# Patient Record
Sex: Female | Born: 1935 | Race: Black or African American | Hispanic: No | Marital: Single | Smoking: Never smoker
Health system: Southern US, Community
[De-identification: ages and names within clinical notes are randomized; demographics above are authoritative.]

## PROBLEM LIST (undated history)

## (undated) DIAGNOSIS — F0390 Unspecified dementia without behavioral disturbance: Secondary | ICD-10-CM

## (undated) DIAGNOSIS — I10 Essential (primary) hypertension: Secondary | ICD-10-CM

## (undated) DIAGNOSIS — E7800 Pure hypercholesterolemia, unspecified: Secondary | ICD-10-CM

## (undated) DIAGNOSIS — E119 Type 2 diabetes mellitus without complications: Secondary | ICD-10-CM

## (undated) DIAGNOSIS — F32A Depression, unspecified: Secondary | ICD-10-CM

## (undated) HISTORY — PX: APPENDECTOMY: SHX54

---

## 2018-03-11 ENCOUNTER — Ambulatory Visit (INDEPENDENT_AMBULATORY_CARE_PROVIDER_SITE_OTHER): Payer: Medicare Other | Admitting: Podiatry

## 2018-03-11 ENCOUNTER — Encounter: Payer: Self-pay | Admitting: Podiatry

## 2018-03-11 DIAGNOSIS — B351 Tinea unguium: Secondary | ICD-10-CM | POA: Diagnosis not present

## 2018-03-11 DIAGNOSIS — M79675 Pain in left toe(s): Secondary | ICD-10-CM | POA: Diagnosis not present

## 2018-03-11 DIAGNOSIS — M79674 Pain in right toe(s): Secondary | ICD-10-CM | POA: Diagnosis not present

## 2018-03-11 DIAGNOSIS — E1159 Type 2 diabetes mellitus with other circulatory complications: Secondary | ICD-10-CM

## 2018-03-11 NOTE — Progress Notes (Signed)
This patient presents to the office with chief complaint of long thick nails and diabetic feet.  This patient  says she  is having no pain and discomfort in her feet.  This patient says  she has long thick painful nails.  These nails are painful walking and wearing shoes.  She has no history of infection or drainage from both feet.  This patient presents the office today for treatment of the  long nails and a foot evaluation due to history of  Diabetes. Patient was referred by Dr.  Mauro KaufmannBafare.  General Appearance  Alert, conversant and in no acute stress.  Vascular  Dorsalis pedis  are weakly  palpable  Bilaterally.Posterior tibial pulses are absent  B/L. Capillary return is within normal limits  bilaterally. Cold feet noted.  Neurologic  Senn-Weinstein monofilament wire test within normal limits  bilaterally. Muscle power within normal limits bilaterally.  Nails Thick disfigured discolored nails with subungual debris  from hallux to fifth toes bilaterally. No evidence of bacterial infection or drainage bilaterally.  Orthopedic  No limitations of motion of motion feet .  No crepitus or effusions noted.  No bony pathology or digital deformities noted.  Skin  normotropic skin with no porokeratosis noted bilaterally.  No signs of infections or ulcers noted.     Onychomycosis  Diabetes with no foot complications  IE  Debride nails x 10.  A diabetic foot exam was performed and there is evidence of vascular disease with no nerve damage. RTC 3 months.   Helane GuntherGregory Alecxander Mainwaring DPM

## 2018-03-17 ENCOUNTER — Other Ambulatory Visit: Payer: Self-pay | Admitting: Internal Medicine

## 2018-03-17 DIAGNOSIS — F0391 Unspecified dementia with behavioral disturbance: Secondary | ICD-10-CM

## 2018-03-23 ENCOUNTER — Encounter (HOSPITAL_COMMUNITY): Payer: Self-pay | Admitting: Emergency Medicine

## 2018-03-23 ENCOUNTER — Emergency Department (HOSPITAL_COMMUNITY)
Admission: EM | Admit: 2018-03-23 | Discharge: 2018-03-23 | Disposition: A | Discharge status: Home / Self Care | Payer: Medicare Other | Attending: Emergency Medicine | Admitting: Emergency Medicine

## 2018-03-23 ENCOUNTER — Emergency Department (HOSPITAL_COMMUNITY): Payer: Medicare Other

## 2018-03-23 ENCOUNTER — Other Ambulatory Visit: Payer: Self-pay

## 2018-03-23 DIAGNOSIS — Z9114 Patient's other noncompliance with medication regimen: Secondary | ICD-10-CM | POA: Insufficient documentation

## 2018-03-23 DIAGNOSIS — R51 Headache: Secondary | ICD-10-CM | POA: Diagnosis not present

## 2018-03-23 DIAGNOSIS — Z7982 Long term (current) use of aspirin: Secondary | ICD-10-CM | POA: Diagnosis not present

## 2018-03-23 DIAGNOSIS — I10 Essential (primary) hypertension: Secondary | ICD-10-CM | POA: Diagnosis not present

## 2018-03-23 DIAGNOSIS — Z79899 Other long term (current) drug therapy: Secondary | ICD-10-CM | POA: Insufficient documentation

## 2018-03-23 DIAGNOSIS — Z7984 Long term (current) use of oral hypoglycemic drugs: Secondary | ICD-10-CM | POA: Diagnosis not present

## 2018-03-23 HISTORY — DX: Pure hypercholesterolemia, unspecified: E78.00

## 2018-03-23 HISTORY — DX: Essential (primary) hypertension: I10

## 2018-03-23 LAB — CBC WITH DIFFERENTIAL/PLATELET
BASOS PCT: 0 %
Basophils Absolute: 0 10*3/uL (ref 0.0–0.1)
EOS PCT: 4 %
Eosinophils Absolute: 0.2 10*3/uL (ref 0.0–0.7)
HEMATOCRIT: 38.2 % (ref 36.0–46.0)
Hemoglobin: 12.2 g/dL (ref 12.0–15.0)
Lymphocytes Relative: 43 %
Lymphs Abs: 1.8 10*3/uL (ref 0.7–4.0)
MCH: 30.7 pg (ref 26.0–34.0)
MCHC: 31.9 g/dL (ref 30.0–36.0)
MCV: 96.2 fL (ref 78.0–100.0)
Monocytes Absolute: 0.5 10*3/uL (ref 0.1–1.0)
Monocytes Relative: 11 %
NEUTROS ABS: 1.7 10*3/uL (ref 1.7–7.7)
Neutrophils Relative %: 42 %
PLATELETS: 244 10*3/uL (ref 150–400)
RBC: 3.97 MIL/uL (ref 3.87–5.11)
RDW: 14.4 % (ref 11.5–15.5)
WBC: 4.1 10*3/uL (ref 4.0–10.5)

## 2018-03-23 LAB — BASIC METABOLIC PANEL
ANION GAP: 13 (ref 5–15)
BUN: 21 mg/dL — ABNORMAL HIGH (ref 6–20)
CALCIUM: 9.9 mg/dL (ref 8.9–10.3)
CO2: 26 mmol/L (ref 22–32)
Chloride: 104 mmol/L (ref 101–111)
Creatinine, Ser: 1.05 mg/dL — ABNORMAL HIGH (ref 0.44–1.00)
GFR, EST AFRICAN AMERICAN: 54 mL/min — AB (ref >60)
GFR, EST NON AFRICAN AMERICAN: 46 mL/min — AB (ref >60)
Glucose, Bld: 81 mg/dL (ref 65–99)
Potassium: 4.2 mmol/L (ref 3.5–5.1)
Sodium: 143 mmol/L (ref 135–145)

## 2018-03-23 LAB — I-STAT TROPONIN, ED: TROPONIN I, POC: 0.01 ng/mL (ref 0.00–0.08)

## 2018-03-23 MED ORDER — AMLODIPINE BESYLATE 5 MG PO TABS
5.0000 mg | ORAL_TABLET | Freq: Once | ORAL | Status: AC
  Administered 2018-03-23: 5 mg via ORAL
  Filled 2018-03-23: qty 1

## 2018-03-23 MED ORDER — LABETALOL HCL 5 MG/ML IV SOLN
10.0000 mg | INTRAVENOUS | Status: DC | PRN
  Administered 2018-03-23 (×5): 10 mg via INTRAVENOUS
  Filled 2018-03-23 (×3): qty 4

## 2018-03-23 MED ORDER — AMLODIPINE BESYLATE 5 MG PO TABS: 5.0000 mg | ORAL_TABLET | Freq: Every day | ORAL | 0 refills | Status: DC

## 2018-03-23 MED ORDER — SODIUM CHLORIDE 0.9 % IV SOLN
INTRAVENOUS | Status: DC
  Administered 2018-03-23: 20:00:00 via INTRAVENOUS

## 2018-03-23 NOTE — ED Provider Notes (Signed)
[facility] EMERGENCY DEPARTMENT Provider Note   CSN: [id number] Arrival date & time: 03/23/18  1524     History   Chief Complaint Chief Complaint  Patient presents with  . Hypertension    HPI Angelica Lane is a 84 y.o. female.  The patient presents for evaluation of headache with high blood pressure.  Today a home health nurse came to her home and advised her to come here because her blood pressure has been elevated both today and on prior days when the family checked it at home.  Also, she has been seeing her PCP, to follow her blood pressure which has been high.  About 1 month ago her PCP advised her to take her Toprol medication, only in the morning instead of twice a day.  Apparently she was having trouble remembering taking the evening dose.  She denies blurred vision, cough, chest pain, abdominal or back pain.  There is been no focal weakness or paresthesia.  She continues to be able to ambulate normally, and does not usually require a assistive device.  She is here with family members who contribute to the history.  She has not been having trouble eating, and has not noticed any changes in her bowel or urinary habits.  There are no other known modifying factors.   HPI  Past Medical History:  Diagnosis Date  . High cholesterol   . Hypertension     There are no active problems to display for this patient.   Past Surgical History:  Procedure Laterality Date  . APPENDECTOMY       OB History   None      Home Medications    Prior to Admission medications   Medication Sig Start Date End Date Taking? Authorizing Provider  aspirin 81 MG chewable tablet aspirin 81 mg chewable tablet  Chew 1 tablet every day by oral route.   Yes [provider]  Calcium Carbonate-Vitamin D (CALCIUM 500/D PO) Calcium 500  500 daily   Yes [provider]  divalproex (DEPAKOTE SPRINKLE) 125 MG capsule divalproex 125 mg capsule,delayed release sprinkle  twice a day   Yes [provider]  escitalopram (LEXAPRO) 5 MG tablet escitalopram 5 mg tablet daily   Yes [provider]  ezetimibe (ZETIA) 10 MG tablet ezetimibe 10 mg tablet daily   Yes [provider]  losartan (COZAAR) 100 MG tablet losartan 100 mg tablet in the morning   Yes [provider]  metFORMIN (GLUCOPHAGE-XR) 500 MG 24 hr tablet metformin ER 1000 mg tablet,extended release 24 hr in the morning   Yes [provider]  metoprolol succinate (TOPROL-XL) 100 MG 24 hr tablet metoprolol succinate ER 200 mg tablet,extended release 24 hr in the morning   Yes [provider]  simvastatin (ZOCOR) 10 MG tablet simvastatin 10 mg tablet in the morning   Yes [provider]  amLODipine (NORVASC) 5 MG tablet Take 1 tablet (5 mg total) by mouth daily. 03/23/18   Mancel Bale, MD    Family History No family history on file.  Social History Social History   Tobacco Use  . Smoking status: Never Smoker  . Smokeless tobacco: Never Used  Substance Use Topics  . Alcohol use: Not Currently  . Drug use: Never     Allergies   Other   Review of Systems Review of Systems  All other systems reviewed and are negative.    Physical Exam Updated Vital Signs BP (!) 209/60  Pulse (!) 53   Temp (!) 97.5 F (36.4 C) (Oral)   Resp 16   SpO2 100%   Physical Exam  Constitutional: She is oriented to person, place, and time. She appears well-developed. No distress.  Frail, elderly  HENT:  Head: Normocephalic and atraumatic.  Eyes: Pupils are equal, round, and reactive to light. Conjunctivae and EOM are normal.  Neck: Normal range of motion and phonation normal. Neck supple.  Cardiovascular: Normal rate and regular rhythm.  Pulmonary/Chest: Effort normal and breath sounds normal. She exhibits no tenderness.  Abdominal: Soft. She exhibits no distension. There is no tenderness. There is no guarding.  Musculoskeletal: Normal range  of motion.  Neurological: She is alert and oriented to person, place, and time. She exhibits normal muscle tone.  No dysarthria, or aphasia.  Skin: Skin is warm and dry.  Psychiatric: She has a normal mood and affect. Her behavior is normal. Judgment and thought content normal.  Nursing note and vitals reviewed.    ED Treatments / Results  Labs (all labs ordered are listed, but only abnormal results are displayed) Labs Reviewed  BASIC METABOLIC PANEL - Abnormal; Notable for the following components:      Result Value   BUN 21 (*)    Creatinine, Ser 1.05 (*)    GFR calc non Af Amer 46 (*)    GFR calc Af Amer 54 (*)    All other components within normal limits  CBC WITH DIFFERENTIAL/PLATELET  I-STAT TROPONIN, ED    EKG None  Radiology Dg Chest 2 View  Result Date: 03/23/2018 CLINICAL DATA:  Headaches for 2 weeks.  Smoker. EXAM: CHEST - 2 VIEW COMPARISON:  None. FINDINGS: Cardiomediastinal silhouette is normal. Calcified aortic arch. Mild bronchitic changes. No pleural effusions or focal consolidations. Trachea projects midline and there is no pneumothorax. Soft tissue planes and included osseous structures are non-suspicious. Mild degenerative change of the thoracic spine. Old distal RIGHT clavicle fracture. IMPRESSION: Mild bronchitic changes without focal consolidation. Aortic Atherosclerosis (ICD10-I70.0). Electronically Signed   By: Awilda Metro M.D.   On: 03/23/2018 19:46   Ct Head Wo Contrast  Result Date: 03/23/2018 CLINICAL DATA:  Headache.  Elevated blood pressure for 1 week. EXAM: CT HEAD WITHOUT CONTRAST TECHNIQUE: Contiguous axial images were obtained from the base of the skull through the vertex without intravenous contrast. COMPARISON:  None. FINDINGS: Brain: No evidence of acute infarction, hemorrhage, hydrocephalus, extra-axial collection or mass lesion/mass effect. There is ventricular and sulcal enlargement reflecting age-appropriate volume loss. Vascular: No  hyperdense vessel or unexpected calcification. Skull: Normal. Negative for fracture or focal lesion. Sinuses/Orbits: Globes and orbits are unremarkable. Visualized sinuses and mastoid air cells are clear. Other: None. IMPRESSION: 1. No acute intracranial abnormalities. 2. Age related volume loss. Electronically Signed   By: Amie Portland M.D.   On: 03/23/2018 19:37    Procedures .Critical Care Performed by: Mancel Bale, MD Authorized by: Mancel Bale, MD   Critical care provider statement:    Critical care time (minutes):  35   Critical care start time:  03/23/2018 7:00 PM   Critical care end time:  03/23/2018 10:00 PM   (including critical care time)  Medications Ordered in ED Medications  amLODipine (NORVASC) tablet 5 mg (5 mg Oral Given 03/23/18 2208)     Initial Impression / Assessment and Plan / ED Course  I have reviewed the triage vital signs and the nursing notes.  Pertinent labs & imaging results that were available during my care  of the patient were reviewed by me and considered in my medical decision making (see chart for details).  Clinical Course as of Mar 25 913  Wed Mar 23, 2018  2200 Following multiple doses of labetalol, blood pressure improved, mean arterial pressure significantly improved.  No change in mental status.   [EW]  Fri Mar 25, 2018  0911 Normal  [facility]Stat Troponin[facility] [EW]  0911 Normal  CBC with Differential [EW]  0911 Normal except mildly elevated BUN and creatinine  Basic metabolic panel(!) [EW]  0911 Mild nonspecific bronchitis, no congestive heart failure or infiltrate  DG Chest 2 View [EW]  0912 No bleeding or infarct  CT Head Wo Contrast [EW]    Clinical Course User Index [EW] Mancel BaleWentz, Rafay Dahan, MD     No data found.  At discharge- reevaluation with update and discussion. After initial assessment and treatment, an updated evaluation reveals she remains alert and comfortable.  She has no further complaints.  Findings  discussed with family and patient, all questions answered. Mancel BaleElliott Belem Hintze   Medical decision making-elevated blood pressure without significant signs for end organ damage.  Doubt hypertensive emergency.  Blood pressure lowered, with intravenous medication, followed by oral antihypertensive begun in the emergency department.  Patient will need ongoing close management by her PCP, as well as additional blood pressure control with a third antihypertensive.  Doubt CVA, metabolic instability or impending vascular collapse.   Nursing Notes Reviewed/ Care Coordinated Applicable Imaging Reviewed Interpretation of Laboratory Data incorporated into ED treatment  The patient appears reasonably screened and/or stabilized for discharge and I doubt any other medical condition or other [facility] requiring further screening, evaluation, or treatment in the ED at this time prior to discharge.  Plan: Home Medications-continue current medications; Home Treatments-rest, fluids; return here if the recommended treatment, does not improve the symptoms; Recommended follow up-PCP follow-up within 1 week for blood pressure check and further management.    Final Clinical Impressions(s) / ED Diagnoses   Final diagnoses:  Hypertension, unspecified type    ED Discharge Orders        Ordered    amLODipine (NORVASC) 5 MG tablet  Daily     03/23/18 2157       Mancel BaleWentz, Janelle Culton, MD 03/25/18 [phone]

## 2018-03-23 NOTE — ED Provider Notes (Signed)
Patient placed in Quick Look pathway, seen and evaluated   Chief Complaint: blood pressure high  HPI:   Patient with history of hypertension, apparently full compliance with her medications, here because home nurse checked her blood pressure today and it was high.  Patient denies any complaints.  She reports intermittent headache, that comes and goes, describes it as mild, none at this time.  States her family doctor has increased her blood pressure medication about a month ago because her blood pressure was high at that time.  ROS: positive for headaches  Physical Exam:   Gen: No distress  Neuro: Awake and Alert  Skin: Warm    Focused Exam: NAD, lungs clear bilaterally, regular HR and rhythm. 5/5 and equal upper and lower extremity strength bilaterally. Equal grip strength bilaterally. Normal finger to nose and heel to shin. No pronator drift.   Pt hypertensive, BP is 259/84, apparently compliant with her medications that family dispenses for her. Will check basic labs.   Initiation of care has begun. The patient has been counseled on the process, plan, and necessity for staying for the completion/evaluation, and the remainder of the medical screening examination    Jaynie CrumbleKirichenko, Aidric Endicott, PA-C 03/23/18 1615    Melene PlanFloyd, Dan, DO 03/23/18 2237

## 2018-03-23 NOTE — Discharge Instructions (Signed)
Start the new prescription for blood pressure in the morning.  Call your doctor for a follow-up appointment.

## 2018-03-23 NOTE — ED Notes (Signed)
Patient transported to CT 

## 2018-03-23 NOTE — ED Triage Notes (Signed)
States a nurse came to her house today for a 6 month assessment for her insurance.  She was told her BP was high and to go to ED.  Pt denies any complaints.

## 2018-03-28 ENCOUNTER — Ambulatory Visit
Admission: RE | Admit: 2018-03-28 | Discharge: 2018-03-28 | Disposition: A | Discharge status: Home / Self Care | Payer: Medicare Other | Source: Ambulatory Visit | Attending: Internal Medicine | Admitting: Internal Medicine

## 2018-03-28 DIAGNOSIS — F0391 Unspecified dementia with behavioral disturbance: Secondary | ICD-10-CM

## 2018-03-28 MED ORDER — GADOBENATE DIMEGLUMINE 529 MG/ML IV SOLN
14.0000 mL | Freq: Once | INTRAVENOUS | Status: AC | PRN
  Administered 2018-03-28: 14 mL via INTRAVENOUS

## 2018-03-30 ENCOUNTER — Other Ambulatory Visit: Payer: Self-pay

## 2018-06-03 ENCOUNTER — Ambulatory Visit: Payer: Medicare Other | Admitting: Podiatry

## 2018-06-22 ENCOUNTER — Encounter: Payer: Self-pay | Admitting: Podiatry

## 2018-06-22 ENCOUNTER — Ambulatory Visit (INDEPENDENT_AMBULATORY_CARE_PROVIDER_SITE_OTHER): Payer: Medicare Other | Admitting: Podiatry

## 2018-06-22 DIAGNOSIS — B351 Tinea unguium: Secondary | ICD-10-CM | POA: Diagnosis not present

## 2018-06-22 DIAGNOSIS — M79675 Pain in left toe(s): Secondary | ICD-10-CM

## 2018-06-22 DIAGNOSIS — M79674 Pain in right toe(s): Secondary | ICD-10-CM

## 2018-06-22 DIAGNOSIS — E1159 Type 2 diabetes mellitus with other circulatory complications: Secondary | ICD-10-CM

## 2018-06-22 NOTE — Progress Notes (Signed)
Complaint:  Visit Type: Patient returns to my office for continued preventative foot care services. Complaint: Patient states" my nails have grown long and thick and become painful to walk and wear shoes" Patient has been diagnosed with DM with no foot complications. The patient presents for preventative foot care services. No changes to ROS  Podiatric Exam: Vascular: dorsalis pedis and posterior tibial pulses are weakly  palpable bilateral. Posterior tibial pulses are absent  B/L. Capillary return is immediate. Cold feet. Skin turgor WNL  Sensorium: Normal Semmes Weinstein monofilament test. Normal tactile sensation bilaterally. Nail Exam: Pt has thick disfigured discolored nails with subungual debris noted bilateral entire nail hallux through fifth toenails Ulcer Exam: There is no evidence of ulcer or pre-ulcerative changes or infection. Orthopedic Exam: Muscle tone and strength are WNL. No limitations in general ROM. No crepitus or effusions noted. Foot type and digits show no abnormalities. Bony prominences are unremarkable. Skin: No Porokeratosis. No infection or ulcers  Diagnosis:  Onychomycosis, , Pain in right toe, pain in left toes  Treatment & Plan Procedures and Treatment: Consent by patient was obtained for treatment procedures.   Debridement of mycotic and hypertrophic toenails, 1 through 5 bilateral and clearing of subungual debris. No ulceration, no infection noted.  Return Visit-Office Procedure: Patient instructed to return to the office for a follow up visit 3 months for continued evaluation and treatment.    Angelica Lane DPM

## 2018-09-14 ENCOUNTER — Encounter: Payer: Self-pay | Admitting: Podiatry

## 2018-09-14 ENCOUNTER — Ambulatory Visit (INDEPENDENT_AMBULATORY_CARE_PROVIDER_SITE_OTHER): Payer: Medicare Other | Admitting: Podiatry

## 2018-09-14 DIAGNOSIS — M79675 Pain in left toe(s): Secondary | ICD-10-CM | POA: Diagnosis not present

## 2018-09-14 DIAGNOSIS — M79674 Pain in right toe(s): Secondary | ICD-10-CM

## 2018-09-14 DIAGNOSIS — E1159 Type 2 diabetes mellitus with other circulatory complications: Secondary | ICD-10-CM

## 2018-09-14 DIAGNOSIS — B351 Tinea unguium: Secondary | ICD-10-CM

## 2018-09-14 NOTE — Progress Notes (Signed)
Complaint:  Visit Type: Patient returns to my office for continued preventative foot care services. Complaint: Patient states" my nails have grown long and thick and become painful to walk and wear shoes" Patient has been diagnosed with DM with no foot complications. The patient presents for preventative foot care services. No changes to ROS  Podiatric Exam: Vascular: dorsalis pedis and posterior tibial pulses are weakly  palpable bilateral. Posterior tibial pulses are absent  B/L. Capillary return is immediate. Cold feet. Skin turgor WNL  Sensorium: Normal Semmes Weinstein monofilament test. Normal tactile sensation bilaterally. Nail Exam: Pt has thick disfigured discolored nails with subungual debris noted bilateral entire nail hallux through fifth toenails Ulcer Exam: There is no evidence of ulcer or pre-ulcerative changes or infection. Orthopedic Exam: Muscle tone and strength are WNL. No limitations in general ROM. No crepitus or effusions noted. Foot type and digits show no abnormalities. Bony prominences are unremarkable. Skin: No Porokeratosis. No infection or ulcers  Diagnosis:  Onychomycosis, , Pain in right toe, pain in left toes  Treatment & Plan Procedures and Treatment: Consent by patient was obtained for treatment procedures.   Debridement of mycotic and hypertrophic toenails, 1 through 5 bilateral and clearing of subungual debris. No ulceration, no infection noted.  Return Visit-Office Procedure: Patient instructed to return to the office for a follow up visit 3 months for continued evaluation and treatment.    Sincere Berlanga DPM 

## 2018-09-21 ENCOUNTER — Ambulatory Visit: Payer: Medicare Other | Admitting: Podiatry

## 2018-12-19 ENCOUNTER — Ambulatory Visit: Payer: Medicare Other | Attending: Internal Medicine | Admitting: Physical Therapy

## 2018-12-19 ENCOUNTER — Encounter: Payer: Self-pay | Admitting: Physical Therapy

## 2018-12-19 ENCOUNTER — Other Ambulatory Visit: Payer: Self-pay

## 2018-12-19 DIAGNOSIS — R262 Difficulty in walking, not elsewhere classified: Secondary | ICD-10-CM | POA: Insufficient documentation

## 2018-12-19 DIAGNOSIS — R42 Dizziness and giddiness: Secondary | ICD-10-CM | POA: Diagnosis present

## 2018-12-19 DIAGNOSIS — H8113 Benign paroxysmal vertigo, bilateral: Secondary | ICD-10-CM | POA: Insufficient documentation

## 2018-12-19 NOTE — Therapy (Signed)
[facility] [address] [place], [place], [address] Phone: [phone]   Fax:  [phone]  Physical Therapy Evaluation  Patient Details  Name: Angelica Lane MRN: [id number] Date of Birth: 12/27/1927 Referring Provider (PT): Harvest ForestBakare, Mobolaji B, MD   Encounter Date: 12/19/2018  PT End of Session - 12/19/18 1836    Visit Number  1    Number of Visits  5    Date for PT Re-Evaluation  01/18/19    Authorization Type  Medicare A & B; BCBS    Authorization Time Period  12/19/18 to 02/17/19    PT Start Time  1450    PT Stop Time  1532    PT Time Calculation (min)  42 min    Activity Tolerance  Patient tolerated treatment well    Behavior During Therapy  [facility] for tasks assessed/performed       Past Medical History:  Diagnosis Date  . High cholesterol   . Hypertension     Past Surgical History:  Procedure Laterality Date  . APPENDECTOMY      There were no vitals filed for this visit.   Subjective Assessment - 12/19/18 1453    Subjective  At night I just get dizzy. Sometimes when I roll over. The room feels like it's spinning. Nothing during the day. Denies dizziness when she sits up to EOB    Patient is accompained by:  --   caregiver, Steward DroneBrenda   Pertinent History  CKD, HTN, DM, HLD, glaucoma, dementia (with paranoia)    Patient Stated Goals  to not feel like the room is spinning; reduce dizziness to reduce risk of falling    Currently in Pain?  No/denies         [facility] - 12/19/18 1457      Assessment   Medical Diagnosis   BPPV bil    Referring Provider (PT)  Harvest ForestBakare, Mobolaji B, MD    Onset Date/Surgical Date  --   referral 11/17/18   Prior Therapy  none      Precautions   Precautions  Fall      Restrictions   Weight Bearing Restrictions  No      Balance Screen   Has the patient fallen in the past 6 months  No    Has the patient had a decrease in activity level because of a fear of falling?    No    Is the patient reluctant to leave their home because of a fear of falling?   No      Home Environment   Living Environment  Private residence    Living Arrangements  Children    Available Help at Discharge  Family;Personal care attendant;Available PRN/intermittently   nearly 24/7     Prior Function   Level of Independence  Needs assistance with homemaking;Independent with household mobility with device      Cognition   Overall Cognitive Status  History of cognitive impairments - at baseline      Observation/Other Assessments   Observations  shuffling gait while carries her cane      Bed Mobility   Bed Mobility  Rolling Lane;Rolling Left;Lane Sidelying to Sit;Left Sidelying to Sit;Sit to Sidelying Lane;Sit to Sidelying Left    Rolling Lane  Minimal Assistance - Patient > 75%    Rolling Left  Minimal Assistance - Patient > 75%    Lane Sidelying to Sit  Minimal Assistance - Patient > 75%    Left  Sidelying to Sit  Minimal Assistance - Patient >75%    Sit to Sidelying Lane  Minimal Assistance - Patient > 75%    Sit to Sidelying Left  Minimal Assistance - Patient > 75%      Transfers   Transfers  Sit to Stand    Sit to Stand  4: Min guard      Ambulation/Gait   Ambulation/Gait  Yes    Ambulation/Gait Assistance  4: Min guard    Ambulation Distance (Feet)  40 Feet   x2   Assistive device  Other (Comment)   carries SPC with tip 4" above ground   Gait Pattern  Shuffle           Vestibular Assessment - 12/19/18 0001      Vestibular Assessment   General Observation  shuffling gait, wide BOS      Symptom Behavior   Type of Dizziness  Spinning    Frequency of Dizziness  daily    Duration of Dizziness  minutes    Aggravating Factors  Rolling to Lane;Rolling to left    Relieving Factors  Head stationary      Occulomotor Exam   Occulomotor Alignment  Normal    Spontaneous  Absent    Gaze-induced  Absent    Smooth Pursuits  Saccades    Saccades  Intact       Positional Testing   Dix-Hallpike  Dix-Hallpike Left    Sidelying Test  Sidelying Lane;Sidelying Left    Horizontal Canal Testing  Horizontal Canal Lane;Horizontal Canal Left;Horizontal Canal Lane Intensity;Horizontal Canal Left Intensity      Dix-Hallpike Left   Dix-Hallpike Left Duration  15     Dix-Hallpike Left Symptoms  Upbeat, left rotatory nystagmus      Sidelying Lane   Sidelying Lane Duration  0    Sidelying Lane Symptoms  No nystagmus      Sidelying Left   Sidelying Left Duration  30    Sidelying Left Symptoms  Left nystagmus      Horizontal Canal Lane   Horizontal Canal Lane Duration  30    Horizontal Canal Lane Symptoms  Geotrophic      Horizontal Canal Left   Horizontal Canal Left Duration  45    Horizontal Canal Left Symptoms  Geotrophic      Horizontal Canal Lane Intensity   Horizontal Canal Lane Intensity  Mild      Horizontal Canal Left Intensity   Horizontal Canal Left Intensity  Moderate          Objective measurements completed on examination: See above findings.       Vestibular Treatment/Exercise - 12/19/18 0001      Vestibular Treatment/Exercise   Vestibular Treatment Provided  Canalith Repositioning    Canalith Repositioning  Epley Manuever Left;Appiani Lane       EPLEY MANUEVER LEFT   Number of Reps   1    Overall Response   --   unable to assess afterwards due to lack of time    RESPONSE DETAILS LEFT  +nystagmus steps 1,2, 4      Appiani Lane   Number of Reps   2    Overall Response   Symptoms Resolved    Response Details   to her rt to treat left horiz BPPV;             PT Education - 12/19/18 1834    Education Details  what is BPPV and how to PT can treat;  expect to feel Off-balance for 1-2 hours after treatment; if symptoms resolved, can cancel future appts    Person(s) Educated  Patient;Caregiver(s)    Methods  Explanation    Comprehension  Verbalized understanding          PT Long Term Goals -  12/19/18 1850      PT LONG TERM GOAL #1   Title  Patient will have negative positional testing for BPPV (Target all LTGs 01/18/2019)    Time  4    Period  Weeks    Status  New    Target Date  01/18/19      PT LONG TERM GOAL #2   Title  Patient will report she is asymptomatic when rolling over in her bed at night.     Time  4    Period  Weeks    Status  New      PT LONG TERM GOAL #3   Title  Assess for vestibular hypofunction if appropriate/indicated.     Time  4    Period  Weeks    Status  New             Plan - 12/19/18 1840    Clinical Impression Statement  Patient referred to OPPT with diagnosis of bil BPPV with pt reporting room spins when she rolls over in bed. Patient initially found to have left horizontal canalithiasis which was successfully treated with Appiani maneuver. Patient then found to have left posterior canalithiasis and completed one repetition of left Epley maneuver (limited by time). Patient experiencing anticipated sense of imbalance after cannalith repositioning and therefore unable to be sure that BPPV has been fully cleared. Recommend patient make additional appointments for re-assessment and treatment. Patient can benefit from PT to address the deficits listed below via the interventions listed below.     History and Personal Factors relevant to plan of care:  PMH-CKD, HTN, DM, HLD, glaucoma, dementia (with paranoia); Personal factors-decr cognition however has supportive aide and family    Clinical Presentation  Evolving    Clinical Presentation due to:  +BPPV with nystagmus; unsure if cleared after one session    Clinical Decision Making  Low    Rehab Potential  Good    PT Frequency  1x / week    PT Duration  4 weeks    PT Treatment/Interventions  ADLs/Self Care Home Management;Canalith Repostioning;Therapeutic exercise;Balance training;Neuromuscular re-education;Patient/family education;Vestibular;Visual/perceptual remediation/compensation    PT Next  Visit Plan  reassess for left posterior BPPV, left horizontal BPPV and treat as needed    Consulted and Agree with Plan of Care  Patient;Family member/caregiver    Family Member [employer], aide       Patient will benefit from skilled therapeutic intervention in order to improve the following deficits and impairments:  Decreased balance, Dizziness, Difficulty walking  Visit Diagnosis: BPPV (benign paroxysmal positional vertigo), bilateral - Plan: PT plan of care cert/re-cert  Difficulty in walking, not elsewhere classified - Plan: PT plan of care cert/re-cert  Dizziness and giddiness - Plan: PT plan of care cert/re-cert     Problem List There are no active problems to display for this patient.   Zena Amos, PT 12/19/2018, 6:54 PM  [facility] [address] [place], [place], [address] Phone: [phone]   Fax:  [phone]  Name: Adabella Mansour MRN: [id number] Date of Birth: [birth year]

## 2018-12-21 ENCOUNTER — Ambulatory Visit (INDEPENDENT_AMBULATORY_CARE_PROVIDER_SITE_OTHER): Payer: Medicare Other | Admitting: Podiatry

## 2018-12-21 ENCOUNTER — Encounter: Payer: Self-pay | Admitting: Podiatry

## 2018-12-21 DIAGNOSIS — M79675 Pain in left toe(s): Secondary | ICD-10-CM

## 2018-12-21 DIAGNOSIS — B351 Tinea unguium: Secondary | ICD-10-CM | POA: Diagnosis not present

## 2018-12-21 DIAGNOSIS — E1159 Type 2 diabetes mellitus with other circulatory complications: Secondary | ICD-10-CM

## 2018-12-21 DIAGNOSIS — M79674 Pain in right toe(s): Secondary | ICD-10-CM | POA: Diagnosis not present

## 2018-12-21 NOTE — Progress Notes (Addendum)
Complaint:  Visit Type: Patient returns to my office for continued preventative foot care services. Complaint: Patient states" my nails have grown long and thick and become painful to walk and wear shoes" Patient has been diagnosed with DM with no foot complications. The patient presents for preventative foot care services. No changes to ROS  Podiatric Exam: Vascular: dorsalis pedis and posterior tibial pulses are weakly  palpable bilateral. Posterior tibial pulses are absent  B/L. Capillary return is immediate. Cold feet. Skin turgor WNL  Sensorium: Normal Semmes Weinstein monofilament test. Normal tactile sensation bilaterally. Nail Exam: Pt has thick disfigured discolored nails with subungual debris noted bilateral entire nail hallux through fifth toenails Ulcer Exam: There is no evidence of ulcer or pre-ulcerative changes or infection. Orthopedic Exam: Muscle tone and strength are WNL. No limitations in general ROM. No crepitus or effusions noted. Foot type and digits show no abnormalities. Bony prominences are unremarkable. Skin: No Porokeratosis. No infection or ulcers  Diagnosis:  Onychomycosis, , Pain in right toe, pain in left toes  Treatment & Plan Procedures and Treatment: Consent by patient was obtained for treatment procedures.   Debridement of mycotic and hypertrophic toenails, 1 through 5 bilateral and clearing of subungual debris. No ulceration, no infection noted.  ABN signed for 2020. Return Visit-Office Procedure: Patient instructed to return to the office for a follow up visit 3 months for continued evaluation and treatment.    Helane Gunther DPM

## 2019-01-06 ENCOUNTER — Encounter: Payer: Self-pay | Admitting: Physical Therapy

## 2019-01-06 ENCOUNTER — Ambulatory Visit: Payer: Medicare Other | Admitting: Physical Therapy

## 2019-01-06 DIAGNOSIS — H8113 Benign paroxysmal vertigo, bilateral: Secondary | ICD-10-CM

## 2019-01-06 DIAGNOSIS — R42 Dizziness and giddiness: Secondary | ICD-10-CM

## 2019-01-07 NOTE — Therapy (Signed)
[facility] [address] [place], [place], [address] Phone: [phone]   Fax:  [phone]  Physical Therapy Treatment  Patient Details  Name: Katherina RightBetty Wenzl MRN: [id number] Date of Birth: 07/21/1928 Referring Provider (PT): Harvest ForestBakare, Mobolaji B, MD   Encounter Date: 01/06/2019  PT End of Session - 01/06/19 1454    Visit Number  2    Number of Visits  5    Date for PT Re-Evaluation  01/18/19    Authorization Type  Medicare A & B; BCBS    Authorization Time Period  12/19/18 to 02/17/19    PT Start Time  1452    PT Stop Time  1528    PT Time Calculation (min)  36 min    Activity Tolerance  Patient tolerated treatment well    Behavior During Therapy  [facility] for tasks assessed/performed       Past Medical History:  Diagnosis Date  . High cholesterol   . Hypertension     Past Surgical History:  Procedure Laterality Date  . APPENDECTOMY      There were no vitals filed for this visit.  Subjective Assessment - 01/06/19 1700    Subjective  Dizziness is less intense. Still has spinning at night only (when lying in bed and on her back). Now reports she can stop the dizziness by turning over onto her side (previously rolling provoked spinning, but no longer).    Patient is accompained by:  --   Caregiver remained in lobby   Pertinent History  CKD, HTN, DM, HLD, glaucoma, dementia (with paranoia)    Patient Stated Goals  to not feel like the room is spinning; reduce dizziness to reduce risk of falling    Currently in Pain?  No/denies             Vestibular Assessment - 01/06/19 1456      Symptom Behavior   Type of Dizziness  Spinning    Frequency of Dizziness  not every day, only at night when lying in the bed    Duration of Dizziness  seconds    Relieving Factors  Comments   turn on her side     Vestibulo-Occular Reflex   VOR to Slow Head Movement  Normal    Comment  could not relax enough to allow accurate HIT       Dix-Hallpike Left   Dix-Hallpike Left Duration  45 sec     Dix-Hallpike Left Symptoms  Downbeat, right rotatory nystagmus   2nd rep after straight head hang & demi-semont, upbeating left rotary               Vestibular Treatment/Exercise - 01/06/19 2122      Vestibular Treatment/Exercise   Vestibular Treatment Provided  Canalith Repositioning    Canalith Repositioning  Epley Manuever Left   straight head hang; demi-semont to rt with head turn rt      EPLEY MANUEVER LEFT   Number of Reps   2   Overall Response   Improved Symptoms     RESPONSE DETAILS LEFT             PT Education - 01/06/19 2125    Education Details  if symptoms resolve, may cancel final appt    Person(s) Educated  Patient;Caregiver(s)    Methods  Explanation    Comprehension  Verbalized understanding          PT Long Term Goals - 12/19/18 1850  PT LONG TERM GOAL #1   Title  Patient will have negative positional testing for BPPV (Target all LTGs 01/18/2019)    Time  4    Period  Weeks    Status  New    Target Date  01/18/19      PT LONG TERM GOAL #2   Title  Patient will report she is asymptomatic when rolling over in her bed at night.     Time  4    Period  Weeks    Status  New      PT LONG TERM GOAL #3   Title  Assess for vestibular hypofunction if appropriate/indicated.     Time  4    Period  Weeks    Status  New            Plan - 01/07/19 3557    Clinical Impression Statement  Patient reported improvement after initial evaluation and treatment (see subjective). Left Dix-Hallpike initially produced downbeating right rotary nystagmus. Completed left Epley and suspected left posterior canal apogeotropic BPPV. Performed straight head hang followed by quick return to longsitting followed by demi-Semont for left posterior canal. On reassessment of left Dix-Hallpike, pt with upbeating left rotary nystagmus and treated with left Epley. Patient with no symptoms on return  to upright sitting (each time previously she still had spinning). Instructed pt to keep her next appointment scheduled and to return if still having spinning, however if we cleared the crystals today she should not have further spinning and can call to cancel final appt. Also informed pt's caregiver who was waiting in lobby.     Rehab Potential  Good    PT Frequency  1x / week    PT Duration  4 weeks    PT Treatment/Interventions  ADLs/Self Care Home Management;Canalith Repostioning;Therapeutic exercise;Balance training;Neuromuscular re-education;Patient/family education;Vestibular;Visual/perceptual remediation/compensation    PT Next Visit Plan  reassess for left posterior BPPV, and treat as needed    Consulted and Agree with Plan of Care  Patient;Family member/caregiver    Family Member [employer], aide       Patient will benefit from skilled therapeutic intervention in order to improve the following deficits and impairments:  Decreased balance, Dizziness, Difficulty walking  Visit Diagnosis: BPPV (benign paroxysmal positional vertigo), bilateral  Dizziness and giddiness     Problem List There are no active problems to display for this patient.   Zena Amos, PT 01/07/2019, 8:31 AM  [facility] [address] [place], [place], [address] Phone: [phone]   Fax:  [phone]  Name: Delema Cohan MRN: [id number] Date of Birth: [birth year]

## 2019-01-20 ENCOUNTER — Ambulatory Visit: Payer: Medicare Other | Attending: Internal Medicine | Admitting: Physical Therapy

## 2019-03-22 ENCOUNTER — Ambulatory Visit: Payer: Medicare Other | Admitting: Podiatry

## 2019-06-09 ENCOUNTER — Ambulatory Visit: Payer: Medicare Other | Admitting: Podiatry

## 2019-08-31 ENCOUNTER — Other Ambulatory Visit: Payer: Self-pay | Admitting: Internal Medicine

## 2019-08-31 DIAGNOSIS — E2839 Other primary ovarian failure: Secondary | ICD-10-CM

## 2019-10-25 ENCOUNTER — Other Ambulatory Visit: Payer: Self-pay

## 2019-10-25 ENCOUNTER — Encounter (HOSPITAL_COMMUNITY): Payer: Self-pay

## 2019-10-25 ENCOUNTER — Ambulatory Visit (HOSPITAL_COMMUNITY)
Admission: EM | Admit: 2019-10-25 | Discharge: 2019-10-25 | Disposition: A | Discharge status: Home / Self Care | Payer: Medicare Other | Attending: Family Medicine | Admitting: Family Medicine

## 2019-10-25 DIAGNOSIS — W19XXXA Unspecified fall, initial encounter: Secondary | ICD-10-CM | POA: Insufficient documentation

## 2019-10-25 DIAGNOSIS — F0390 Unspecified dementia without behavioral disturbance: Secondary | ICD-10-CM | POA: Insufficient documentation

## 2019-10-25 DIAGNOSIS — R42 Dizziness and giddiness: Secondary | ICD-10-CM | POA: Diagnosis present

## 2019-10-25 DIAGNOSIS — Z79899 Other long term (current) drug therapy: Secondary | ICD-10-CM | POA: Diagnosis not present

## 2019-10-25 DIAGNOSIS — Y92009 Unspecified place in unspecified non-institutional (private) residence as the place of occurrence of the external cause: Secondary | ICD-10-CM | POA: Insufficient documentation

## 2019-10-25 DIAGNOSIS — Z7901 Long term (current) use of anticoagulants: Secondary | ICD-10-CM | POA: Diagnosis not present

## 2019-10-25 DIAGNOSIS — E7800 Pure hypercholesterolemia, unspecified: Secondary | ICD-10-CM | POA: Insufficient documentation

## 2019-10-25 DIAGNOSIS — R269 Unspecified abnormalities of gait and mobility: Secondary | ICD-10-CM

## 2019-10-25 DIAGNOSIS — I10 Essential (primary) hypertension: Secondary | ICD-10-CM | POA: Insufficient documentation

## 2019-10-25 DIAGNOSIS — Z7984 Long term (current) use of oral hypoglycemic drugs: Secondary | ICD-10-CM | POA: Diagnosis not present

## 2019-10-25 LAB — CK: Total CK: 114 U/L (ref 38–234)

## 2019-10-25 LAB — BASIC METABOLIC PANEL
Anion gap: 10 (ref 5–15)
BUN: 31 mg/dL — ABNORMAL HIGH (ref 8–23)
CO2: 23 mmol/L (ref 22–32)
Calcium: 9.8 mg/dL (ref 8.9–10.3)
Chloride: 108 mmol/L (ref 98–111)
Creatinine, Ser: 1.39 mg/dL — ABNORMAL HIGH (ref 0.44–1.00)
GFR calc Af Amer: 39 mL/min — ABNORMAL LOW (ref >60)
GFR calc non Af Amer: 34 mL/min — ABNORMAL LOW (ref >60)
Glucose, Bld: 243 mg/dL — ABNORMAL HIGH (ref 70–99)
Potassium: 4.6 mmol/L (ref 3.5–5.1)
Sodium: 141 mmol/L (ref 135–145)

## 2019-10-25 LAB — POCT URINALYSIS DIP (DEVICE)
Bilirubin Urine: NEGATIVE
Glucose, UA: NEGATIVE mg/dL
Hgb urine dipstick: NEGATIVE
Nitrite: NEGATIVE
Protein, ur: 30 mg/dL — AB
Specific Gravity, Urine: 1.015 (ref 1.005–1.030)
Urobilinogen, UA: 0.2 mg/dL (ref 0.0–1.0)
pH: 6 (ref 5.0–8.0)

## 2019-10-25 NOTE — ED Triage Notes (Signed)
Pt presents to UC w/ c/o dizziness today after putting her clothes on in the chair. Pt was found by caretaker on ground on her bottom out of chair putting her clothes on. Pt states she just started a stronger BP medication this past week. Pt denies hitting her head during fall.

## 2019-10-25 NOTE — Discharge Instructions (Addendum)
We will call if the lab work is abnormal Monitor for worsening symptoms and if so then go to the ER.  Otherwise follow up with your primary care doctor.

## 2019-10-26 LAB — URINE CULTURE

## 2019-10-26 NOTE — ED Provider Notes (Signed)
[place]    CSN: [id number] Arrival date & time: 10/25/19  1422      History   Chief Complaint Chief Complaint  Patient presents with  . Dizziness    HPI Angelica Lane is a 85 y.o. female.   Patient is an 85 year old female with past medical history of high cholesterol, hypertension, dementia.  She presents today with dizziness and fall.  Reporting that she was sitting in a chair putting her clothes on this morning and got mildly dizzy where she slid to the floor.  She landed on her bottom.  She did not hit her head or lose any consciousness.  Caregiver found her sitting on a chair.  She was sitting there approximately 1 hour.  Denies any current dizziness or headache but she is having trouble with her gait.  She was started on a new blood pressure medication this week.  Blood pressure is 127/49 here today.  She is bradycardic but history of same.  No blurred vision, chest pain or shortness of breath.  Her son is here with her today.  No fever, chills, abdominal pain, back pain, nausea, vomiting, diarrhea, constipation, urinary frequency,, dysuria or hematuria.  She denies any pain. No recent viral illnes.   ROS per HPI      Past Medical History:  Diagnosis Date  . High cholesterol   . Hypertension     There are no active problems to display for this patient.   Past Surgical History:  Procedure Laterality Date  . APPENDECTOMY      OB History   No obstetric history on file.      Home Medications    Prior to Admission medications   Medication Sig Start Date End Date Taking? Authorizing Provider  carvedilol (COREG) 12.5 MG tablet Take 12.5 mg by mouth 2 (two) times daily with a meal.   Yes [provider]  cloNIDine (CATAPRES) 0.1 MG tablet Take 0.1 mg by mouth 2 (two) times daily.   Yes [provider]  memantine (NAMENDA) 5 MG tablet Take 5 mg by mouth 2 (two) times daily.   Yes [provider]  OLANZapine (ZYPREXA) 2.5 MG  tablet Take 2.5 mg by mouth at bedtime.   Yes [provider]  amLODipine (NORVASC) 5 MG tablet Take 1 tablet (5 mg total) by mouth daily. Patient taking differently: Take 10 mg by mouth daily.  03/23/18   Daleen Bo, MD  ammonium lactate (LAC-HYDRIN) 12 % lotion Apply topically 2 (two) times daily. 08/22/18   [provider]  ammonium lactate (LAC-HYDRIN) 12 % lotion ammonium lactate 12 % lotion  Apply topically BID    [provider]  Calcium Carbonate-Vitamin D (CALCIUM 500/D PO) Calcium 500  500 daily    [provider]  donepezil (ARICEPT) 5 MG tablet Take 5 mg by mouth daily. 08/01/18   [provider]  escitalopram (LEXAPRO) 5 MG tablet escitalopram 5 mg tablet daily    [provider]  ezetimibe (ZETIA) 10 MG tablet ezetimibe 10 mg tablet daily    [provider]  losartan (COZAAR) 100 MG tablet losartan 100 mg tablet in the morning    [provider]  metFORMIN (GLUCOPHAGE-XR) 500 MG 24 hr tablet 500 mg 2 (two) times daily.     [provider]  simvastatin (ZOCOR) 10 MG tablet simvastatin 10 mg tablet in the morning    [provider]  divalproex (DEPAKOTE SPRINKLE) 125 MG capsule divalproex 125 mg capsule,delayed  release sprinkle twice a day  10/25/19  [provider]  metoprolol succinate (TOPROL-XL) 100 MG 24 hr tablet metoprolol succinate ER 200 mg tablet,extended release 24 hr in the morning  10/25/19  [provider]    Family History Family History  Problem Relation Age of Onset  . Healthy Mother   . Healthy Father     Social History Social History   Tobacco Use  . Smoking status: Never Smoker  . Smokeless tobacco: Never Used  Substance Use Topics  . Alcohol use: Not Currently  . Drug use: Never     Allergies   Other   Review of Systems Review of Systems   Physical Exam Triage Vital Signs ED Triage Vitals  Enc Vitals Group     BP 10/25/19 1439  (!) 127/49     Pulse Rate 10/25/19 1439 (!) 51     Resp 10/25/19 1439 16     Temp 10/25/19 1439 97.6 F (36.4 C)     Temp Source 10/25/19 1439 Oral     SpO2 10/25/19 1439 100 %     Weight --      Height --      Head Circumference --      Peak Flow --      Pain Score 10/25/19 1444 0     Pain Loc --      Pain Edu? --      Excl. in GC? --    No data found.  Updated Vital Signs BP (!) 127/49 (BP Location: Left Arm)   Pulse (!) 51   Temp 97.6 F (36.4 C) (Oral)   Resp 16   SpO2 100%   Visual Acuity Right Eye Distance:   Left Eye Distance:   Bilateral Distance:    Right Eye Near:   Left Eye Near:    Bilateral Near:     Physical Exam Vitals signs and nursing note reviewed.  Constitutional:      General: She is not in acute distress.    Appearance: She is not ill-appearing, toxic-appearing or diaphoretic.  HENT:     Head: Normocephalic and atraumatic.     Nose: Nose normal.     Mouth/Throat:     Pharynx: Oropharynx is clear.  Eyes:     Extraocular Movements: Extraocular movements intact.     Conjunctiva/sclera: Conjunctivae normal.     Comments: Pinpoint pupils but equal.   Neck:     Musculoskeletal: Normal range of motion.  Cardiovascular:     Rate and Rhythm: Normal rate and regular rhythm.     Pulses: Normal pulses.     Heart sounds: Normal heart sounds.  Pulmonary:     Effort: Pulmonary effort is normal.     Breath sounds: Normal breath sounds.  Abdominal:     Palpations: Abdomen is soft.     Tenderness: There is no abdominal tenderness.  Skin:    General: Skin is warm and dry.  Neurological:     General: No focal deficit present.     Mental Status: She is alert. Mental status is at baseline.     Cranial Nerves: No cranial nerve deficit.     Sensory: No sensory deficit.     Motor: Weakness present.     Gait: Gait abnormal.     Comments: No slurred speech, facial droop.  Alert to her normal.  Strength equal in all extremities.  Shuffled walking.    Psychiatric:        Mood and  Affect: Mood normal.      UC Treatments / Results  Labs (all labs ordered are listed, but only abnormal results are displayed) Labs Reviewed  URINE CULTURE - Abnormal; Notable for the following components:      Result Value   Culture MULTIPLE SPECIES PRESENT, SUGGEST RECOLLECTION (*)    All other components within normal limits  BASIC METABOLIC PANEL - Abnormal; Notable for the following components:   Glucose, Bld 243 (*)    BUN 31 (*)    Creatinine, Ser 1.39 (*)    GFR calc non Af Amer 34 (*)    GFR calc Af Amer 39 (*)    All other components within normal limits  POCT URINALYSIS DIP (DEVICE) - Abnormal; Notable for the following components:   Ketones, ur TRACE (*)    Protein, ur 30 (*)    Leukocytes,Ua SMALL (*)    All other components within normal limits  CK    EKG   Radiology No results found.  Procedures Procedures (including critical care time)  Medications Ordered in UC Medications - No data to display  Initial Impression / Assessment and Plan / UC Course  I have reviewed the triage vital signs and the nursing notes.  Pertinent labs & imaging results that were available during my care of the patient were reviewed by me and considered in my medical decision making (see chart for details).     85 year old female presenting today with dizziness, fall and abnormal gait.  Patient has a history of dementia. No head injury Her vital signs are stable and she is nontoxic looking today. No unilateral weakness, slurred speech or facial droop  She has shuffled gait and needing assistance to walk. Typically she walks well at home and sometimes uses a walker. No concerning signs or symptoms of CVA today. Urine with small leuks.  Will send for culture. Recent change in blood pressure medication.  She is not hypotensive today. Blood work showed elevated glucose in the 240s. Blood work also showed kidney disease which is most likely  chronic. Looking at previous labs creatinine and GFR was similar to previous blood work taken back in June. She could be mildly dehydrated.  We are unsure of how much she has been drinking. Patient is also suffering from polypharmacy which could be the cause of her symptoms. We will have her increase fluids and follow-up with her primary care doctor. If she starts developing any more severe dizziness, blurred vision, strokelike symptoms, chest pain or shortness of breath she will need to go to the ER. Patient and son understand and agree. Final Clinical Impressions(s) / UC Diagnoses   Final diagnoses:  Fall in home, initial encounter     Discharge Instructions     We will call if the lab work is abnormal Monitor for worsening symptoms and if so then go to the ER.  Otherwise follow up with your primary care doctor.     ED Prescriptions    None     PDMP not reviewed this encounter.   Janace ArisBast, Hays Dunnigan A, NP 10/26/19 1526

## 2019-11-28 ENCOUNTER — Other Ambulatory Visit: Payer: Medicare Other

## 2020-02-16 ENCOUNTER — Other Ambulatory Visit: Payer: Medicare Other

## 2020-02-20 ENCOUNTER — Other Ambulatory Visit: Payer: Self-pay

## 2020-02-20 ENCOUNTER — Encounter: Payer: Self-pay | Admitting: Podiatry

## 2020-02-20 ENCOUNTER — Ambulatory Visit (INDEPENDENT_AMBULATORY_CARE_PROVIDER_SITE_OTHER): Payer: Medicare Other | Admitting: Podiatry

## 2020-02-20 DIAGNOSIS — E1159 Type 2 diabetes mellitus with other circulatory complications: Secondary | ICD-10-CM

## 2020-02-20 DIAGNOSIS — M79674 Pain in right toe(s): Secondary | ICD-10-CM

## 2020-02-20 DIAGNOSIS — B351 Tinea unguium: Secondary | ICD-10-CM

## 2020-02-20 DIAGNOSIS — M79675 Pain in left toe(s): Secondary | ICD-10-CM | POA: Diagnosis not present

## 2020-02-20 NOTE — Progress Notes (Signed)
This patient returns to my office for at risk foot care.  This patient requires this care by a professional since this patient will be at risk due to having  Diabetes.  She presents with her caregiver.  Patient has not been seen in over one year. This patient is unable to cut nails themselves since the patient cannot reach their nails.These nails are painful walking and wearing shoes.  This patient presents for at risk foot care today.  General Appearance  Alert, conversant and in no acute stress.  Vascular  Dorsalis pedis and posterior tibial  pulses are absent   bilaterally.  Capillary return is within normal limits  bilaterally. Temperature is within normal limits  bilaterally.  Neurologic  Senn-Weinstein monofilament wire test diminished   bilaterally. Muscle power within normal limits bilaterally.  Nails Thick disfigured discolored nails with subungual debris  from hallux to fifth toes bilaterally. No evidence of bacterial infection or drainage bilaterally.  Orthopedic  No limitations of motion  feet .  No crepitus or effusions noted.  No bony pathology or digital deformities noted.  Skin  Dry scaly skin with no porokeratosis  B/L. bilaterally.  No signs of infections or ulcers noted.     Onychomycosis  Pain in right toe  Pain in left toe.  Consent was obtained for treatment procedures.  Debridement and grinding of long thick nails with clearing of subungual debris.  No infection or ulcer.     Return office visit   3 months.       Told patient to return for periodic foot care and evaluation due to potential at risk complications.   Helane Gunther DPM

## 2020-03-22 ENCOUNTER — Other Ambulatory Visit: Payer: Medicare Other

## 2020-05-21 ENCOUNTER — Ambulatory Visit: Payer: Medicare Other | Admitting: Podiatry

## 2020-08-21 ENCOUNTER — Emergency Department (HOSPITAL_COMMUNITY): Payer: Medicare Other

## 2020-08-21 ENCOUNTER — Encounter (HOSPITAL_COMMUNITY): Payer: Self-pay | Admitting: *Deleted

## 2020-08-21 ENCOUNTER — Other Ambulatory Visit: Payer: Self-pay

## 2020-08-21 ENCOUNTER — Emergency Department (HOSPITAL_COMMUNITY)
Admission: EM | Admit: 2020-08-21 | Discharge: 2020-08-21 | Disposition: A | Discharge status: Home / Self Care | Payer: Medicare Other | Attending: Emergency Medicine | Admitting: Emergency Medicine

## 2020-08-21 DIAGNOSIS — R42 Dizziness and giddiness: Secondary | ICD-10-CM | POA: Diagnosis not present

## 2020-08-21 DIAGNOSIS — W04XXXA Fall while being carried or supported by other persons, initial encounter: Secondary | ICD-10-CM | POA: Insufficient documentation

## 2020-08-21 DIAGNOSIS — Y929 Unspecified place or not applicable: Secondary | ICD-10-CM | POA: Insufficient documentation

## 2020-08-21 DIAGNOSIS — Y999 Unspecified external cause status: Secondary | ICD-10-CM | POA: Insufficient documentation

## 2020-08-21 DIAGNOSIS — S0990XA Unspecified injury of head, initial encounter: Secondary | ICD-10-CM

## 2020-08-21 DIAGNOSIS — Z7984 Long term (current) use of oral hypoglycemic drugs: Secondary | ICD-10-CM | POA: Diagnosis not present

## 2020-08-21 DIAGNOSIS — E119 Type 2 diabetes mellitus without complications: Secondary | ICD-10-CM | POA: Insufficient documentation

## 2020-08-21 DIAGNOSIS — M25561 Pain in right knee: Secondary | ICD-10-CM

## 2020-08-21 DIAGNOSIS — Y939 Activity, unspecified: Secondary | ICD-10-CM | POA: Insufficient documentation

## 2020-08-21 DIAGNOSIS — W19XXXA Unspecified fall, initial encounter: Secondary | ICD-10-CM

## 2020-08-21 DIAGNOSIS — Z79899 Other long term (current) drug therapy: Secondary | ICD-10-CM | POA: Insufficient documentation

## 2020-08-21 DIAGNOSIS — I10 Essential (primary) hypertension: Secondary | ICD-10-CM | POA: Diagnosis not present

## 2020-08-21 HISTORY — DX: Unspecified dementia, unspecified severity, without behavioral disturbance, psychotic disturbance, mood disturbance, and anxiety: F03.90

## 2020-08-21 HISTORY — DX: Type 2 diabetes mellitus without complications: E11.9

## 2020-08-21 HISTORY — DX: Depression, unspecified: F32.A

## 2020-08-21 NOTE — ED Notes (Signed)
Pt seen ambulating with assistance of nurse tech. Pt able ambulate with use of walker without difficulty at this time.

## 2020-08-21 NOTE — ED Triage Notes (Signed)
Pt fell this morning in bathroom. Hit nose, lip and eye on rt side. No LOC, fell while getting dressed. No thinners

## 2020-08-21 NOTE — TOC Initial Note (Signed)
Transition of Care [facility]) - Initial/Assessment Note    Patient Details  Name: Angelica Lane MRN: [id number] Date of Birth: September 20, 1928  [facility]) CM/SW Contact:    Elliot Cousin, RN Phone Number:  [phone] 08/21/2020, 5:44 PM  Clinical Narrative:                 TOC CM spoke to pt's DIL, Bridgette. Pt lives in home with son and DIL. States they are seeking Memory Care for long term goals. Pt has a caregiver that come 3x per week from 12-4 pm. Family pays out of pocket for caregiver. They plan to apply for Medicaid. Explained she will need FL2 from the PCP to start process for Memory Care. HH will be arranged with for RN, PT and SW. Explained HH SW will assist with getting FL2 and placement of Memory Care. Offered choice. DIL agreed on [facility]. Contacted [facility] rep, Kandee Keen and explained pt's case. Able to accept referral.   Expected Discharge Plan: Home w Home Health Services Barriers to Discharge: No Barriers Identified   Patient Goals and CMS Choice Patient states their goals for this hospitalization and ongoing recovery are:: would prefer patient have some assistance with getting arranged to go to Memory Care CMS Medicare.gov Compare Post Acute Care list provided to:: Patient Represenative (must comment) Secretary/administrator) Choice offered to / list presented to : Adult Children  Expected Discharge Plan and Services Expected Discharge Plan: Home w Home Health Services In-house Referral: Clinical Social Work Discharge Planning Services: CM Consult Post Acute Care Choice: Home Health Living arrangements for the past 2 months: Single Family Home                           HH Arranged: RN, PT, Social Work HH Agency: [facility] Date [facility] Agency Contacted: 08/21/20 Time HH Agency Contacted: [phone] Representative spoke with at [facility] Agency: Lorenza Chick  Prior Living Arrangements/Services Living arrangements for the past 2 months: Single Family Home Lives  with:: Adult Children Patient language and need for interpreter reviewed:: Yes Do you feel safe going back to the place where you live?: Yes      Need for Family Participation in Patient Care: Yes (Comment) Care giver support system in place?: Yes (comment) Current home services: DME (rolling walker) Criminal Activity/Legal Involvement Pertinent to Current Situation/Hospitalization: No - Comment as needed  Activities of Daily Living      Permission Sought/Granted Permission sought to share information with : Case Manager, PCP, Family Supports, Other (comment) Permission granted to share information with : Yes, Verbal Permission Granted  Share Information with NAME: Estate manager/land agent  Permission granted to share info w AGENCY: Home Health  Permission granted to share info w Relationship: DIL  Permission granted to share info w Contact Information: [phone]  Emotional Assessment       Orientation: : Oriented to Self   Psych Involvement: No (comment)  Admission diagnosis:  Fall There are no problems to display for this patient.  PCP:  [place], MD Pharmacy:   [facility] [phone], [place] - [address] [place] [address] Phone: [phone] Fax: [phone]     Social Determinants of Health (SDOH) Interventions    Readmission Risk Interventions No flowsheet data found.

## 2020-08-21 NOTE — ED Provider Notes (Signed)
[facility]-EMERGENCY DEPT Provider Note   CSN: [id number] Arrival date & time: 08/21/20  1247     History Chief Complaint  Patient presents with  . Fall    Angelica Lane is a 86 y.o. female.  HPI  Patient is 86 year old female with history of dementia, depression, DM, HLD, HTN  Patient is presented today with fall that occurred prior to arrival today.  She states that she frequently feels dizzy over the past several years and states that she went on to feeling somewhat dizzy and before she was able to sit down she fell.  She states she fell onto the ground in the kitchen.  Her daughter-in-law is with her and able to corroborate this.  Her daughter witnessed the fall and states that she did not lose consciousness or experience any vomiting.  She states that her mental status has been within normal limits.  Patient denies any pain currently and states she had no headache, chest pain or shortness of breath prior to fall.  No abdominal pain. She states that she did have some right knee pain but denies any other areas of pain.  She describes right knee pain is aching, only with movement.  Has taken medications prior to arrival in the ER.  Denies any other associated symptoms.  Denies any aggravating or mitigating factors.  Patient has been having numerous falls recently.  Family called patient's PCP who recommended ER visit for CT scan.     Past Medical History:  Diagnosis Date  . Dementia (HCC)   . Depression   . Diabetes mellitus without complication (HCC)   . High cholesterol   . Hypertension     There are no problems to display for this patient.   Past Surgical History:  Procedure Laterality Date  . APPENDECTOMY       OB History   No obstetric history on file.     Family History  Problem Relation Age of Onset  . Healthy Mother   . Healthy Father     Social History   Tobacco Use  . Smoking status: Never Smoker  . Smokeless tobacco: Never  Used  Substance Use Topics  . Alcohol use: Not Currently  . Drug use: Never    Home Medications Prior to Admission medications   Medication Sig Start Date End Date Taking? Authorizing Provider  amLODipine (NORVASC) 5 MG tablet Take 1 tablet (5 mg total) by mouth daily. Patient taking differently: Take 10 mg by mouth daily.  03/23/18   Mancel Bale, MD  ammonium lactate (LAC-HYDRIN) 12 % lotion Apply topically 2 (two) times daily. 08/22/18   [provider]  ammonium lactate (LAC-HYDRIN) 12 % lotion ammonium lactate 12 % lotion  Apply topically BID    [provider]  Calcium Carbonate-Vitamin D (CALCIUM 500/D PO) Calcium 500  500 daily    [provider]  carvedilol (COREG) 12.5 MG tablet Take 12.5 mg by mouth 2 (two) times daily with a meal.    [provider]  cloNIDine (CATAPRES) 0.1 MG tablet Take 0.1 mg by mouth 2 (two) times daily.    [provider]  donepezil (ARICEPT) 5 MG tablet Take 5 mg by mouth daily. 08/01/18   [provider]  escitalopram (LEXAPRO) 5 MG tablet escitalopram 5 mg tablet daily    [provider]  ezetimibe (ZETIA) 10 MG tablet ezetimibe 10 mg tablet daily    [provider]  losartan (COZAAR) 100 MG tablet losartan 100  mg tablet in the morning    [provider]  memantine (NAMENDA) 5 MG tablet Take 5 mg by mouth 2 (two) times daily.    [provider]  metFORMIN (GLUCOPHAGE-XR) 500 MG 24 hr tablet 500 mg 2 (two) times daily.     [provider]  OLANZapine (ZYPREXA) 2.5 MG tablet Take 2.5 mg by mouth at bedtime.    [provider]  simvastatin (ZOCOR) 10 MG tablet simvastatin 10 mg tablet in the morning    [provider]  divalproex (DEPAKOTE SPRINKLE) 125 MG capsule divalproex 125 mg capsule,delayed release sprinkle twice a day  10/25/19  [provider]  metoprolol succinate (TOPROL-XL) 100 MG 24 hr tablet metoprolol succinate ER 200  mg tablet,extended release 24 hr in the morning  10/25/19  [provider]    Allergies    Other  Review of Systems   Review of Systems  Constitutional: Negative for fever.  HENT: Negative for congestion.   Respiratory: Negative for shortness of breath.   Cardiovascular: Negative for chest pain.  Gastrointestinal: Negative for abdominal distention.  Musculoskeletal:       Right knee pain  Neurological: Negative for dizziness and headaches.    Physical Exam Updated Vital Signs BP (!) 149/61 (BP Location: Left Arm)   Pulse (!) 55   Temp 98.7 F (37.1 C) (Oral)   Resp 20   Ht 4\' 11"  (1.499 m)   SpO2 100%   Physical Exam Vitals and nursing note reviewed.  Constitutional:      General: She is not in acute distress. HENT:     Head: Normocephalic and atraumatic.     Nose: Nose normal.     Mouth/Throat:     Mouth: Mucous membranes are moist.  Eyes:     General: No scleral icterus. Cardiovascular:     Rate and Rhythm: Normal rate and regular rhythm.     Pulses: Normal pulses.     Heart sounds: Normal heart sounds.  Pulmonary:     Effort: Pulmonary effort is normal. No respiratory distress.     Breath sounds: No wheezing.  Abdominal:     Palpations: Abdomen is soft.     Tenderness: There is no abdominal tenderness. There is no guarding or rebound.  Musculoskeletal:     Cervical back: Normal range of motion.     Right lower leg: No edema.     Left lower leg: No edema.     Comments: Some pain with ROM of right knee. No focal bony TTP. No deformity.  No other bony tenderness over joints or long bones of the upper and lower extremities.     No neck or back midline tenderness, step-off, deformity, or bruising. Able to turn head left and right 45 degrees without difficulty.  Full range of motion of upper and lower extremity joints shown after palpation was conducted; with 5/5 symmetrical strength in upper and lower extremities. No chest wall tenderness, no facial or  cranial tenderness.   Patient has intact sensation grossly in lower and upper extremities. Intact patellar and ankle reflexes. Patient able to ambulate without difficulty.  Radial and DP pulses palpated BL.   Skin:    General: Skin is warm and dry.     Capillary Refill: Capillary refill takes less than 2 seconds.  Neurological:     Mental Status: She is alert. Mental status is at baseline.     Comments: Oriented to self and immediate events.  This is patient's  baseline per daughter-in-law.  Moves all 4 extremities.  Sensation intact in all 4 extremities.  No cranial nerve deficits.  Psychiatric:        Mood and Affect: Mood normal.        Behavior: Behavior normal.     ED Results / Procedures / Treatments   Labs (all labs ordered are listed, but only abnormal results are displayed) Labs Reviewed - No data to display  EKG None  Radiology CT HEAD WO CONTRAST  Result Date: 08/21/2020 CLINICAL DATA:  Larey Seat this morning, facial trauma EXAM: CT HEAD WITHOUT CONTRAST CT CERVICAL SPINE WITHOUT CONTRAST TECHNIQUE: Multidetector CT imaging of the head and cervical spine was performed following the standard protocol without intravenous contrast. Multiplanar CT image reconstructions of the cervical spine were also generated. COMPARISON:  03/23/2018 FINDINGS: CT HEAD FINDINGS Brain: No acute infarct or hemorrhage. Lateral ventricles and midline structures are unremarkable. No acute extra-axial fluid collections. Diffuse cerebral atrophy unchanged. No mass effect. Vascular: No hyperdense vessel or unexpected calcification. Skull: Normal. Negative for fracture or focal lesion. Sinuses/Orbits: No acute finding. Other: None. CT CERVICAL SPINE FINDINGS Alignment: There is straightening of the cervical spine, likely due to extensive multilevel spondylosis and facet hypertrophy. Otherwise alignment is anatomic. Skull base and vertebrae: No acute displaced fracture. Soft tissues and spinal canal: No  prevertebral fluid or swelling. No visible canal hematoma. Disc levels: There is diffuse cervical facet hypertrophy, with bony fusion across the left C2/C3 facet joints. There is multilevel spondylosis most pronounced at C4-5, C5-6, and C6-7. No significant central canal or neural foraminal encroachment. Upper chest: Airway is patent.  Right apical scarring. Other: Reconstructed images demonstrate no additional findings. IMPRESSION: 1. No acute intracranial process. 2. No acute cervical spine fracture. Extensive multilevel spondylosis and facet hypertrophy. Electronically Signed   By: Sharlet Salina M.D.   On: 08/21/2020 17:05   CT Cervical Spine Wo Contrast  Result Date: 08/21/2020 CLINICAL DATA:  Larey Seat this morning, facial trauma EXAM: CT HEAD WITHOUT CONTRAST CT CERVICAL SPINE WITHOUT CONTRAST TECHNIQUE: Multidetector CT imaging of the head and cervical spine was performed following the standard protocol without intravenous contrast. Multiplanar CT image reconstructions of the cervical spine were also generated. COMPARISON:  03/23/2018 FINDINGS: CT HEAD FINDINGS Brain: No acute infarct or hemorrhage. Lateral ventricles and midline structures are unremarkable. No acute extra-axial fluid collections. Diffuse cerebral atrophy unchanged. No mass effect. Vascular: No hyperdense vessel or unexpected calcification. Skull: Normal. Negative for fracture or focal lesion. Sinuses/Orbits: No acute finding. Other: None. CT CERVICAL SPINE FINDINGS Alignment: There is straightening of the cervical spine, likely due to extensive multilevel spondylosis and facet hypertrophy. Otherwise alignment is anatomic. Skull base and vertebrae: No acute displaced fracture. Soft tissues and spinal canal: No prevertebral fluid or swelling. No visible canal hematoma. Disc levels: There is diffuse cervical facet hypertrophy, with bony fusion across the left C2/C3 facet joints. There is multilevel spondylosis most pronounced at C4-5, C5-6, and  C6-7. No significant central canal or neural foraminal encroachment. Upper chest: Airway is patent.  Right apical scarring. Other: Reconstructed images demonstrate no additional findings. IMPRESSION: 1. No acute intracranial process. 2. No acute cervical spine fracture. Extensive multilevel spondylosis and facet hypertrophy. Electronically Signed   By: Sharlet Salina M.D.   On: 08/21/2020 17:05   DG Knee Complete 4 Views Right  Result Date: 08/21/2020 CLINICAL DATA:  Right knee pain after fall today. EXAM: RIGHT KNEE - COMPLETE 4+ VIEW COMPARISON:  None. FINDINGS: No evidence of  fracture, dislocation, or joint effusion. Severe narrowing of medial joint space is noted. Moderate narrowing of patellofemoral space is noted with osteophyte formation. Chondrocalcinosis is noted in lateral joint space. Soft tissues are unremarkable. IMPRESSION: Severe degenerative joint disease. No acute abnormality seen in the right knee. Electronically Signed   By: Lupita RaiderJames  Green Jr M.D.   On: 08/21/2020 16:28    Procedures Procedures (including critical care time)  Medications Ordered in ED Medications - No data to display  ED Course  I have reviewed the triage vital signs and the nursing notes.  Pertinent labs & imaging results that were available during my care of the patient were reviewed by me and considered in my medical decision making (see chart for details).  Patient here for mechanical fall that occurred while she was dressing earlier today.  She is brought in by her daughter-in-law.  Daughter-in-law states that she does need some help setting up home health.  Consult placed to transitional care team and Alesia recommended face-to-face for home health set up.  This was conducted by myself.  Physical exam is notable for some tenderness to the right knee with range of motion but no tenderness to palpation.  Forage motion of all upper and lower extremity joints.  Patient is able to walk.  She does not use any  assistance at baseline she does appear to be needing more care however at home and would benefit from PT, social work, [facility] home health.  Clinical Course as of Aug 21 1741  Wed Aug 21, 2020  1720 IMPRESSION:  I agree with radiology read. No acute fracture. Severe degenerative joint disease. No acute abnormality seen in the right knee.   [WF]  1720 CT head and C-spine without any acute abnormality.  There is evidence of facet hypertrophy and chronic disease here but no acute symptoms or CT findings.   [WF]  1721 I discussed this case with my attending physician who cosigned this note including patient's presenting symptoms, physical exam, and planned diagnostics and interventions. Attending physician stated agreement with plan or made changes to plan which were implemented.   Attending physician assessed patient at bedside.   [WF]    Clinical Course User Index [WF] Gailen ShelterFondaw, Shanyla Marconi S, PA   EKG is unchanged from baseline.  This is a mechanical fall.  Work-up is reassuring plan to discharge with home health to be set up.  Patient is understanding of plan. She has no additional concerns. Daughter-in-law at bedside is also agreeable plan.   MDM Rules/Calculators/A&P                         Final Clinical Impression(s) / ED Diagnoses Final diagnoses:  Fall, initial encounter  Minor head injury, initial encounter  Acute pain of right knee    Rx / DC Orders ED Discharge Orders         Ordered    Home Health        08/21/20 1729    Face-to-face encounter (required for Medicare/Medicaid patients)       Comments: I Gailen ShelterWylder S Gabi Mcfate certify that this patient is under my care and that I, or a nurse practitioner or physician's assistant working with me, had a face-to-face encounter that meets the physician face-to-face encounter requirements with this patient on 08/21/2020. The encounter with the patient was in whole, or in part for the following medical condition(s) which is the primary reason for  home health care (List medical  condition): Dementia, gait instability, weakness   08/21/20 1729           Gailen Shelter, [place] 08/21/20 1742    Lorre Nick, MD 08/21/20 1755

## 2020-09-04 ENCOUNTER — Encounter (HOSPITAL_COMMUNITY)
Admission: EM | Disposition: A | Discharge status: Skilled Nursing Facility | Payer: Self-pay | Source: Home / Self Care | Attending: Internal Medicine

## 2020-09-04 ENCOUNTER — Encounter (HOSPITAL_COMMUNITY): Payer: Self-pay

## 2020-09-04 ENCOUNTER — Other Ambulatory Visit: Payer: Self-pay

## 2020-09-04 ENCOUNTER — Emergency Department (HOSPITAL_COMMUNITY): Payer: Medicare Other

## 2020-09-04 ENCOUNTER — Inpatient Hospital Stay (HOSPITAL_COMMUNITY)
Admission: EM | Admit: 2020-09-04 | Discharge: 2020-09-09 | DRG: 244 | Disposition: A | Discharge status: Skilled Nursing Facility | Payer: Medicare Other | Attending: Internal Medicine | Admitting: Internal Medicine

## 2020-09-04 DIAGNOSIS — Z789 Other specified health status: Secondary | ICD-10-CM

## 2020-09-04 DIAGNOSIS — R531 Weakness: Secondary | ICD-10-CM

## 2020-09-04 DIAGNOSIS — Z79899 Other long term (current) drug therapy: Secondary | ICD-10-CM | POA: Diagnosis not present

## 2020-09-04 DIAGNOSIS — E875 Hyperkalemia: Secondary | ICD-10-CM | POA: Diagnosis present

## 2020-09-04 DIAGNOSIS — D649 Anemia, unspecified: Secondary | ICD-10-CM | POA: Diagnosis present

## 2020-09-04 DIAGNOSIS — Z20822 Contact with and (suspected) exposure to covid-19: Secondary | ICD-10-CM | POA: Diagnosis present

## 2020-09-04 DIAGNOSIS — Z515 Encounter for palliative care: Secondary | ICD-10-CM | POA: Diagnosis not present

## 2020-09-04 DIAGNOSIS — R001 Bradycardia, unspecified: Secondary | ICD-10-CM | POA: Diagnosis present

## 2020-09-04 DIAGNOSIS — E785 Hyperlipidemia, unspecified: Secondary | ICD-10-CM | POA: Diagnosis present

## 2020-09-04 DIAGNOSIS — Z950 Presence of cardiac pacemaker: Secondary | ICD-10-CM | POA: Diagnosis not present

## 2020-09-04 DIAGNOSIS — Z7189 Other specified counseling: Secondary | ICD-10-CM

## 2020-09-04 DIAGNOSIS — R55 Syncope and collapse: Secondary | ICD-10-CM | POA: Diagnosis present

## 2020-09-04 DIAGNOSIS — Z23 Encounter for immunization: Secondary | ICD-10-CM

## 2020-09-04 DIAGNOSIS — F329 Major depressive disorder, single episode, unspecified: Secondary | ICD-10-CM | POA: Diagnosis present

## 2020-09-04 DIAGNOSIS — E119 Type 2 diabetes mellitus without complications: Secondary | ICD-10-CM | POA: Diagnosis present

## 2020-09-04 DIAGNOSIS — Z7984 Long term (current) use of oral hypoglycemic drugs: Secondary | ICD-10-CM

## 2020-09-04 DIAGNOSIS — Z66 Do not resuscitate: Secondary | ICD-10-CM | POA: Diagnosis not present

## 2020-09-04 DIAGNOSIS — I10 Essential (primary) hypertension: Secondary | ICD-10-CM | POA: Diagnosis present

## 2020-09-04 DIAGNOSIS — F0390 Unspecified dementia without behavioral disturbance: Secondary | ICD-10-CM | POA: Diagnosis present

## 2020-09-04 DIAGNOSIS — R296 Repeated falls: Secondary | ICD-10-CM | POA: Diagnosis present

## 2020-09-04 DIAGNOSIS — I469 Cardiac arrest, cause unspecified: Secondary | ICD-10-CM | POA: Diagnosis not present

## 2020-09-04 DIAGNOSIS — I495 Sick sinus syndrome: Principal | ICD-10-CM | POA: Diagnosis present

## 2020-09-04 LAB — I-STAT CHEM 8, ED
BUN: 29 mg/dL — ABNORMAL HIGH (ref 8–23)
Calcium, Ion: 1.22 mmol/L (ref 1.15–1.40)
Chloride: 110 mmol/L (ref 98–111)
Creatinine, Ser: 1.1 mg/dL — ABNORMAL HIGH (ref 0.44–1.00)
Glucose, Bld: 153 mg/dL — ABNORMAL HIGH (ref 70–99)
HCT: 33 % — ABNORMAL LOW (ref 36.0–46.0)
Hemoglobin: 11.2 g/dL — ABNORMAL LOW (ref 12.0–15.0)
Potassium: 5 mmol/L (ref 3.5–5.1)
Sodium: 141 mmol/L (ref 135–145)
TCO2: 19 mmol/L — ABNORMAL LOW (ref 22–32)

## 2020-09-04 LAB — COMPREHENSIVE METABOLIC PANEL
ALT: 22 U/L (ref 0–44)
AST: 26 U/L (ref 15–41)
Albumin: 3.8 g/dL (ref 3.5–5.0)
Alkaline Phosphatase: 82 U/L (ref 38–126)
Anion gap: 10 (ref 5–15)
BUN: 31 mg/dL — ABNORMAL HIGH (ref 8–23)
CO2: 22 mmol/L (ref 22–32)
Calcium: 10 mg/dL (ref 8.9–10.3)
Chloride: 107 mmol/L (ref 98–111)
Creatinine, Ser: 1.38 mg/dL — ABNORMAL HIGH (ref 0.44–1.00)
GFR calc Af Amer: 39 mL/min — ABNORMAL LOW (ref >60)
GFR calc non Af Amer: 34 mL/min — ABNORMAL LOW (ref >60)
Glucose, Bld: 177 mg/dL — ABNORMAL HIGH (ref 70–99)
Potassium: 5.9 mmol/L — ABNORMAL HIGH (ref 3.5–5.1)
Sodium: 139 mmol/L (ref 135–145)
Total Bilirubin: 0.5 mg/dL (ref 0.3–1.2)
Total Protein: 6.8 g/dL (ref 6.5–8.1)

## 2020-09-04 LAB — CBC WITH DIFFERENTIAL/PLATELET
Abs Immature Granulocytes: 0 10*3/uL (ref 0.00–0.07)
Basophils Absolute: 0 10*3/uL (ref 0.0–0.1)
Basophils Relative: 1 %
Eosinophils Absolute: 0.1 10*3/uL (ref 0.0–0.5)
Eosinophils Relative: 3 %
HCT: 36.2 % (ref 36.0–46.0)
Hemoglobin: 11.5 g/dL — ABNORMAL LOW (ref 12.0–15.0)
Immature Granulocytes: 0 %
Lymphocytes Relative: 27 %
Lymphs Abs: 1.2 10*3/uL (ref 0.7–4.0)
MCH: 30.9 pg (ref 26.0–34.0)
MCHC: 31.8 g/dL (ref 30.0–36.0)
MCV: 97.3 fL (ref 80.0–100.0)
Monocytes Absolute: 0.4 10*3/uL (ref 0.1–1.0)
Monocytes Relative: 10 %
Neutro Abs: 2.6 10*3/uL (ref 1.7–7.7)
Neutrophils Relative %: 59 %
Platelets: 276 10*3/uL (ref 150–400)
RBC: 3.72 MIL/uL — ABNORMAL LOW (ref 3.87–5.11)
RDW: 13.1 % (ref 11.5–15.5)
WBC: 4.3 10*3/uL (ref 4.0–10.5)
nRBC: 0 % (ref 0.0–0.2)

## 2020-09-04 LAB — HEMOGLOBIN A1C
Hgb A1c MFr Bld: 6.8 % — ABNORMAL HIGH (ref 4.8–5.6)
Mean Plasma Glucose: 148.46 mg/dL

## 2020-09-04 LAB — POTASSIUM: Potassium: 6.4 mmol/L (ref 3.5–5.1)

## 2020-09-04 LAB — TROPONIN I (HIGH SENSITIVITY)
Troponin I (High Sensitivity): 10 ng/L (ref <18)
Troponin I (High Sensitivity): 8 ng/L (ref <18)

## 2020-09-04 LAB — CBG MONITORING, ED: Glucose-Capillary: 178 mg/dL — ABNORMAL HIGH (ref 70–99)

## 2020-09-04 LAB — TSH: TSH: 1.112 u[IU]/mL (ref 0.350–4.500)

## 2020-09-04 LAB — MAGNESIUM: Magnesium: 1.8 mg/dL (ref 1.7–2.4)

## 2020-09-04 LAB — SARS CORONAVIRUS 2 BY RT PCR (HOSPITAL ORDER, PERFORMED IN ~~LOC~~ HOSPITAL LAB): SARS Coronavirus 2: NEGATIVE

## 2020-09-04 SURGERY — TEMPORARY PACEMAKER
Anesthesia: LOCAL

## 2020-09-04 MED ORDER — ATROPINE SULFATE 1 MG/10ML IJ SOSY
0.5000 mg | PREFILLED_SYRINGE | Freq: Once | INTRAMUSCULAR | Status: AC
  Administered 2020-09-04: 0.5 mg via INTRAVENOUS
  Filled 2020-09-04: qty 10

## 2020-09-04 MED ORDER — ONDANSETRON HCL 4 MG PO TABS: 4.0000 mg | ORAL_TABLET | Freq: Four times a day (QID) | ORAL | Status: DC | PRN

## 2020-09-04 MED ORDER — ACETAMINOPHEN 650 MG RE SUPP: 650.0000 mg | Freq: Four times a day (QID) | RECTAL | Status: DC | PRN

## 2020-09-04 MED ORDER — ACETAMINOPHEN 325 MG PO TABS
650.0000 mg | ORAL_TABLET | Freq: Four times a day (QID) | ORAL | Status: DC | PRN
  Administered 2020-09-05: 650 mg via ORAL
  Filled 2020-09-04: qty 2

## 2020-09-04 MED ORDER — INSULIN ASPART 100 UNIT/ML ~~LOC~~ SOLN
0.0000 [IU] | Freq: Three times a day (TID) | SUBCUTANEOUS | Status: DC
  Administered 2020-09-05: 2 [IU] via SUBCUTANEOUS
  Administered 2020-09-05: 1 [IU] via SUBCUTANEOUS
  Administered 2020-09-06 – 2020-09-07 (×2): 2 [IU] via SUBCUTANEOUS
  Administered 2020-09-07: 1 [IU] via SUBCUTANEOUS
  Administered 2020-09-08: 3 [IU] via SUBCUTANEOUS
  Administered 2020-09-08 (×2): 2 [IU] via SUBCUTANEOUS
  Administered 2020-09-09: 3 [IU] via SUBCUTANEOUS
  Administered 2020-09-09: 2 [IU] via SUBCUTANEOUS

## 2020-09-04 MED ORDER — INSULIN ASPART 100 UNIT/ML ~~LOC~~ SOLN
0.0000 [IU] | Freq: Every day | SUBCUTANEOUS | Status: DC
  Administered 2020-09-06 – 2020-09-08 (×3): 2 [IU] via SUBCUTANEOUS

## 2020-09-04 MED ORDER — INSULIN ASPART 100 UNIT/ML IV SOLN
10.0000 [IU] | Freq: Once | INTRAVENOUS | Status: AC
  Administered 2020-09-04: 10 [IU] via INTRAVENOUS

## 2020-09-04 MED ORDER — SODIUM POLYSTYRENE SULFONATE 15 GM/60ML PO SUSP
15.0000 g | Freq: Once | ORAL | Status: AC
  Administered 2020-09-04: 15 g via ORAL
  Filled 2020-09-04: qty 60

## 2020-09-04 MED ORDER — DEXTROSE 50 % IV SOLN
1.0000 | Freq: Once | INTRAVENOUS | Status: AC
  Administered 2020-09-04: 50 mL via INTRAVENOUS

## 2020-09-04 MED ORDER — SODIUM CHLORIDE 0.9% FLUSH
3.0000 mL | Freq: Two times a day (BID) | INTRAVENOUS | Status: DC
  Administered 2020-09-05 – 2020-09-08 (×7): 3 mL via INTRAVENOUS

## 2020-09-04 MED ORDER — SODIUM CHLORIDE 0.9 % IV SOLN: INTRAVENOUS | Status: DC

## 2020-09-04 MED ORDER — DEXTROSE 50 % IV SOLN
INTRAVENOUS | Status: AC
  Filled 2020-09-04: qty 50

## 2020-09-04 MED ORDER — ONDANSETRON HCL 4 MG/2ML IJ SOLN: 4.0000 mg | Freq: Four times a day (QID) | INTRAMUSCULAR | Status: DC | PRN

## 2020-09-04 NOTE — H&P (Signed)
Cardiology Admission History and Physical:   Patient ID: Angelica RightBetty Palardy MRN: [id number]; DOB: 09/11/1928   Admission date: 09/04/2020  Primary Care Provider: Harvest ForestBakare, Mobolaji B, MD [facility] Cardiologist: No primary care provider on file.   [facility] Electrophysiologist:SK  Chief Complaint:  syncope  Patient Profile:   Angelica Lane is a 86 y.o. female with with modest dementia following a syncopal event-witnessed.  Recent unexplained fall.  History of Present Illness:   Ms. Murriel HopperGloster with hypertension on multiple medications including carvedilol and clonidine with a history of recurrent falls thought in the past to be related to orthostasis.  2 weeks ago had an abrupt fall with facial trauma.  Evaluation at [facility] emergency room was unrevealing.  Today while sitting with her caretaker she was noted to lose consciousness eyes rolled back within a rapid recovery.  She was brought to the emergency room while on the monitor she had another episode associated with sinus slowing.  Not withstanding the patient's dementia, she is able to see that she was lightheaded before the event.  In this regard it is notable that she is aware that she is in the hospital because of these events.  Denies chest pain.  She is mildly active at home.  Currently staying with her daughter in law and son  Past Medical History:  Diagnosis Date  . Dementia (HCC)   . Depression   . Diabetes mellitus without complication (HCC)   . High cholesterol   . Hypertension     Past Surgical History:  Procedure Laterality Date  . APPENDECTOMY       Medications Prior to Admission: Prior to Admission medications   Medication Sig Start Date End Date Taking? Authorizing Provider  amLODipine (NORVASC) 5 MG tablet Take 1 tablet (5 mg total) by mouth daily. Patient taking differently: Take 10 mg by mouth daily.  03/23/18  Yes Mancel BaleWentz, Elliott, MD  ammonium lactate (LAC-HYDRIN) 12 % lotion Apply 1 application  topically every other day.  08/22/18  Yes [provider]  Calcium Carbonate-Vitamin D (CALCIUM 500/D PO) Take 500 mg by mouth daily.    Yes [provider]  carvedilol (COREG) 6.25 MG tablet Take 6.25 mg by mouth 2 (two) times daily with a meal.    Yes [provider]  cloNIDine (CATAPRES) 0.1 MG tablet Take 0.1 mg by mouth 2 (two) times daily.   Yes [provider]  donepezil (ARICEPT) 5 MG tablet Take 5 mg by mouth at bedtime.  08/01/18  Yes [provider]  escitalopram (LEXAPRO) 5 MG tablet Take 5 mg by mouth every morning.    Yes [provider]  ezetimibe (ZETIA) 10 MG tablet Take 10 mg by mouth daily.    Yes [provider]  ketoconazole (NIZORAL) 2 % cream Apply 1 application topically every other day.   Yes [provider]  losartan (COZAAR) 100 MG tablet Take 100 mg by mouth daily.    Yes [provider]  memantine (NAMENDA) 5 MG tablet Take 5 mg by mouth 2 (two) times daily.   Yes [provider]  metFORMIN (GLUCOPHAGE-XR) 500 MG 24 hr tablet Take 500 mg by mouth 2 (two) times daily.    Yes [provider]  Multiple Vitamins-Minerals (ONE-A-DAY 50 PLUS PO) Take 1 tablet by mouth daily.   Yes [provider]  OLANZapine (ZYPREXA) 2.5 MG tablet Take 2.5 mg by mouth every morning.    Yes [provider]  simvastatin (ZOCOR) 10 MG  tablet Take 10 mg by mouth every evening.    Yes [provider]  triamcinolone ointment (KENALOG) 0.1 % Apply 1 application topically 3 (three) times a week. 08/16/20  Yes [provider]  divalproex (DEPAKOTE SPRINKLE) 125 MG capsule divalproex 125 mg capsule,delayed release sprinkle twice a day  10/25/19  [provider]  metoprolol succinate (TOPROL-XL) 100 MG 24 hr tablet metoprolol succinate ER 200 mg tablet,extended release 24 hr in the morning  10/25/19  [provider]     Allergies:   No Known  Allergies  Social History:   Social History   Socioeconomic History  . Marital status: Single    Spouse name: Not on file  . Number of children: Not on file  . Years of education: Not on file  . Highest education level: Not on file  Occupational History  . Not on file  Tobacco Use  . Smoking status: Never Smoker  . Smokeless tobacco: Never Used  Substance and Sexual Activity  . Alcohol use: Not Currently  . Drug use: Never  . Sexual activity: Not on file  Other Topics Concern  . Not on file  Social History Narrative  . Not on file   Social Determinants of Health   Financial Resource Strain:   . Difficulty of Paying Living Expenses: Not on file  Food Insecurity:   . Worried About Programme researcher, broadcasting/film/video in the Last Year: Not on file  . Ran Out of Food in the Last Year: Not on file  Transportation Needs:   . Lack of Transportation (Medical): Not on file  . Lack of Transportation (Non-Medical): Not on file  Physical Activity:   . Days of Exercise per Week: Not on file  . Minutes of Exercise per Session: Not on file  Stress:   . Feeling of Stress : Not on file  Social Connections:   . Frequency of Communication with Friends and Family: Not on file  . Frequency of Social Gatherings with Friends and Family: Not on file  . Attends Religious Services: Not on file  . Active Member of Clubs or Organizations: Not on file  . Attends Banker Meetings: Not on file  . Marital Status: Not on file  Intimate Partner Violence:   . Fear of Current or Ex-Partner: Not on file  . Emotionally Abused: Not on file  . Physically Abused: Not on file  . Sexually Abused: Not on file    Family History:    The patient's family history includes Healthy in her father and mother.    ROS:  Please see the history of present illness.   All other ROS reviewed and negative.     Physical Exam/Data:   Vitals:   09/04/20 1541 09/04/20 1551 09/04/20 1624  BP:   (!) 126/57  Pulse: (!) 49     Resp: 17    Temp: 97.8 F (36.6 C)    TempSrc: Oral    SpO2: 99%    Weight:  49.4 kg   Height:  4\' 11"  (1.499 m)    No intake or output data in the 24 hours ending 09/04/20 1741 Last 3 Weights 09/04/2020  Weight (lbs) 109 lb  Weight (kg) 49.442 kg     Body mass index is 22.02 kg/m.  General: Thin and somewhat cachectic but healthy appearing elderly African-American female, in no acute distress HEENT: normal Lymph: no adenopathy Neck: no  JVD Endocrine:  No thryomegaly Vascular: No carotid bruits; FA pulses  2+ bilaterally without bruits  Cardiac:  normal S1, S2; RRR; 2/6 systolic Lungs:  clear to auscultation bilaterally, no wheezing, rhonchi or rales  Abd: soft, nontender, no hepatomegaly  Ext: no  edema Musculoskeletal:  No deformities, BUE and BLE strength normal and equal Skin: warm and dry  Neuro:  CNs 2-12 intact, no focal abnormalities noted alert and oriented x2 Psych:  Normal affect   ECG 08/21/20 sinus brady ECG Personally reviewed  Sinus @ 48 with diff STE      Laboratory Data:  High Sensitivity Troponin:   Recent Labs  Lab 09/04/20 1621  TROPONINIHS 10      Chemistry Recent Labs  Lab 09/04/20 1621 09/04/20 1629  NA 139 141  K 5.9* 5.0  CL 107 110  CO2 22  --   GLUCOSE 177* 153*  BUN 31* 29*  CREATININE 1.38* 1.10*  CALCIUM 10.0  --   GFRNONAA 34*  --   GFRAA 39*  --   ANIONGAP 10  --     Recent Labs  Lab 09/04/20 1621  PROT 6.8  ALBUMIN 3.8  AST 26  ALT 22  ALKPHOS 82  BILITOT 0.5   Hematology Recent Labs  Lab 09/04/20 1621 09/04/20 1629  WBC 4.3  --   RBC 3.72*  --   HGB 11.5* 11.2*  HCT 36.2 33.0*  MCV 97.3  --   MCH 30.9  --   MCHC 31.8  --   RDW 13.1  --   PLT 276  --    BNPNo results for input(s): BNP, PROBNP in the last 168 hours.  DDimer No results for input(s): DDIMER in the last 168 hours.   Radiology/Studies:  DG Chest Portable 1 View  Result Date: 09/04/2020 CLINICAL DATA:  Syncope EXAM: PORTABLE CHEST  1 VIEW COMPARISON:  03/23/2018 FINDINGS: Single frontal view of the chest demonstrates a stable cardiac silhouette. Stable atherosclerosis of the aortic arch. No airspace disease, effusion, or pneumothorax. No acute bony abnormalities. IMPRESSION: 1. No acute intrathoracic process. Electronically Signed   By: Sharlet Salina M.D.   On: 09/04/2020 16:39           Assessment and Plan:   1. SYNCOPE 2. Sinus arrest 3. Hyperkalemia 4. Renal insufficiency 5. Anemia 6. Dementia 7. Social stresses see below  The patient has syncope with documented sinus node dysfunction occurring in the context of carvedilol therapy and clonidine as well as Namenda and Aricept all of which can be associated with bradycardia.  Carvedilol can be stopped.  Its half-life is 6-10 hours.  Hence, by tomorrow afternoon or Friday morning it should be out of her system.  In the event that she had recurrent bradycardia thereafter, pacing would be indicated as I do not think her dementia would allow the discontinuation of her other medications.  In the event that she does not declare itself with recurrent bradycardia, I would implant a loop recorder to allow for ongoing monitoring of her heart rate following discharge.  The patient's daughter-in-law voices understanding.  There are significant stresses at home related to ongoing needs and care.  Would recommend palliative care consultation (Will take the liberty to write) for goals of care--in addition, we discussed end-of-life care.  The patient has expressed a desire not for extraordinary care which would include ventilation.  We discussed a little bit about the implications of beginning CPR in the event of cardiorespiratory arrest.  She understands that the family does not think that ablation is appropriate.  She will talk with her husband i.e. the patient's son regarding CPR with its potential implications in this very old lady of rib fracture and difficulties with subsequent  ventilation and weaning.  I discussed with Dr. Excell Seltzer, I do not think a temporary pacing wire is the right thing in this 86 year old lady with modest dementia.  Thus far her sinus episodes have returned spontaneously, and obviously this might not occur when a transvenous wire would leave her at risk for perforation and immobility for up to 48 hours and I think those risks are probably greater.   Severity of Illness: The appropriate patient status for this patient is INPATIENT. Inpatient status is judged to be reasonable and necessary in order to provide the required intensity of service to ensure the patient's safety. The patient's presenting symptoms, physical exam findings, and initial radiographic and laboratory data in the context of their chronic comorbidities is felt to place them at high risk for further clinical deterioration. Furthermore, it is not anticipated that the patient will be medically stable for discharge from the hospital within 2 midnights of admission. The following factors support the patient status of inpatient.   " The patient's presenting symptoms include syncope. " The worrisome physical exam findings include sinus arrest. " The initial radiographic and laboratory data are worrisome because of sinus arrest . " The chronic co-morbidities include dementia.   * I certify that at the point of admission it is my clinical judgment that the patient will require inpatient hospital care spanning beyond 2 midnights from the point of admission due to high intensity of service, high risk for further deterioration and high frequency of surveillance required.*    For questions or updates, please contact [facility] Please consult [email or web address] for contact info under     Signed, Sherryl Manges, MD  09/04/2020 5:41 PM

## 2020-09-04 NOTE — ED Notes (Signed)
Pt placed on Zoll 

## 2020-09-04 NOTE — ED Notes (Signed)
Patient very unsteady on feet. Attempted to stand with orthostatic vitals and patients legs trembled and she wobbled back to the bed. Unable to stand for three minutes

## 2020-09-04 NOTE — ED Provider Notes (Signed)
[facility] EMERGENCY DEPARTMENT Provider Note   CSN: [id number] Arrival date & time: 09/04/20  1534     History Chief Complaint  Patient presents with  . Bradycardia    Angelica Lane is a 86 y.o. female.  HPI   86 year old female with a history of dementia, depression, diabetes, hyperlipidemia, hypertension, who presents emergency department today for evaluation of syncopal episode and bradycardia.  There is a level 5 caveat as patient has a history of dementia however she does report that she feels lightheaded.  She denies any other chest pain, shortness of breath or other systemic symptoms.  Daughter-in-law at bedside provides majority of the history.  She states that patient was sitting with her CNA when she suddenly started having generalized shaking, eyes rolled in the back of her head and her dentures fell out.  This lasted about 1 minute and resolved spontaneously.  Patient was initially somewhat confused but returned to baseline after 1 to 2 minutes.  EMS was called and on their arrival patient was bradycardic and hypotensive.  Heart rates were noted in the 40s, she did have trending downwards and have a heart rate in the 20s with a several second pause noted by EMS.  On arrival patient's blood pressure had improved in the 120s. BS was wnl en route.   Past Medical History:  Diagnosis Date  . Dementia (HCC)   . Depression   . Diabetes mellitus without complication (HCC)   . High cholesterol   . Hypertension     Patient Active Problem List   Diagnosis Date Noted  . Symptomatic bradycardia 09/04/2020  . Dementia without behavioral disturbance (HCC) 09/04/2020  . Diabetes (HCC) 09/04/2020  . Essential hypertension 09/04/2020    Past Surgical History:  Procedure Laterality Date  . APPENDECTOMY       OB History   No obstetric history on file.     Family History  Problem Relation Age of Onset  . Healthy Mother   . Healthy Father     Social  History   Tobacco Use  . Smoking status: Never Smoker  . Smokeless tobacco: Never Used  Substance Use Topics  . Alcohol use: Not Currently  . Drug use: Never    Home Medications Prior to Admission medications   Medication Sig Start Date End Date Taking? Authorizing Provider  amLODipine (NORVASC) 5 MG tablet Take 1 tablet (5 mg total) by mouth daily. Patient taking differently: Take 10 mg by mouth daily.  03/23/18  Yes Mancel Bale, MD  ammonium lactate (LAC-HYDRIN) 12 % lotion Apply 1 application topically every other day.  08/22/18  Yes [provider]  Calcium Carbonate-Vitamin D (CALCIUM 500/D PO) Take 500 mg by mouth daily.    Yes [provider]  carvedilol (COREG) 6.25 MG tablet Take 6.25 mg by mouth 2 (two) times daily with a meal.    Yes [provider]  cloNIDine (CATAPRES) 0.1 MG tablet Take 0.1 mg by mouth 2 (two) times daily.   Yes [provider]  donepezil (ARICEPT) 5 MG tablet Take 5 mg by mouth at bedtime.  08/01/18  Yes [provider]  escitalopram (LEXAPRO) 5 MG tablet Take 5 mg by mouth every morning.    Yes [provider]  ezetimibe (ZETIA) 10 MG tablet Take 10 mg by mouth daily.    Yes [provider]  ketoconazole (NIZORAL) 2 % cream Apply 1 application topically every other day.   Yes [provider]  losartan (COZAAR) 100 MG tablet Take 100 mg by mouth daily.    Yes [provider]  memantine (NAMENDA) 5 MG tablet Take 5 mg by mouth 2 (two) times daily.   Yes [provider]  metFORMIN (GLUCOPHAGE-XR) 500 MG 24 hr tablet Take 500 mg by mouth 2 (two) times daily.    Yes [provider]  Multiple Vitamins-Minerals (ONE-A-DAY 50 PLUS PO) Take 1 tablet by mouth daily.   Yes [provider]  OLANZapine (ZYPREXA) 2.5 MG tablet Take 2.5 mg by mouth every morning.    Yes [provider]  simvastatin (ZOCOR) 10 MG tablet Take 10 mg by mouth every evening.     Yes [provider]  triamcinolone ointment (KENALOG) 0.1 % Apply 1 application topically 3 (three) times a week. 08/16/20  Yes [provider]  divalproex (DEPAKOTE SPRINKLE) 125 MG capsule divalproex 125 mg capsule,delayed release sprinkle twice a day  10/25/19  [provider]  metoprolol succinate (TOPROL-XL) 100 MG 24 hr tablet metoprolol succinate ER 200 mg tablet,extended release 24 hr in the morning  10/25/19  [provider]    Allergies    Patient has no known allergies.  Review of Systems   Review of Systems  Constitutional: Negative for fever.  HENT: Negative for ear pain and sore throat.   Eyes: Negative for visual disturbance.  Respiratory: Negative for cough and shortness of breath.   Cardiovascular: Negative for chest pain.  Gastrointestinal: Negative for abdominal pain, constipation, diarrhea, nausea and vomiting.  Genitourinary: Negative for dysuria and hematuria.  Musculoskeletal: Negative for back pain.  Skin: Negative for rash.  Neurological: Positive for dizziness, syncope and light-headedness.  All other systems reviewed and are negative.   Physical Exam Updated Vital Signs BP (!) 126/57 (BP Location: Right Arm)   Pulse (!) 49   Temp 97.8 F (36.6 C) (Oral)   Resp 17   Ht 4\' 11"  (1.499 m)   Wt 49.4 kg   SpO2 99%   BMI 22.02 kg/m   Physical Exam Vitals and nursing note reviewed.  Constitutional:      General: She is not in acute distress.    Appearance: She is well-developed.  HENT:     Head: Normocephalic and atraumatic.     Mouth/Throat:     Mouth: Mucous membranes are dry.  Eyes:     Conjunctiva/sclera: Conjunctivae normal.  Cardiovascular:     Rate and Rhythm: Regular rhythm. Bradycardia present.     Heart sounds: Normal heart sounds. No murmur heard.   Pulmonary:     Effort: Pulmonary effort is normal. No respiratory distress.     Breath sounds: Normal breath sounds. No wheezing, rhonchi or rales.    Abdominal:     General: Bowel sounds are normal. There is no distension.     Palpations: Abdomen is soft.     Tenderness: There is no abdominal tenderness. There is no guarding or rebound.  Musculoskeletal:     Cervical back: Neck supple.  Skin:    General: Skin is warm and dry.  Neurological:     Mental Status: She is alert.     Comments: Mental Status:  Alert, oriented to self and place only. Demented.  Speech fluent without evidence of aphasia. Able to follow 2 step commands without difficulty.  Cranial Nerves:  II: pupils equal, round, reactive to light III,IV, VI: ptosis not present, extra-ocular motions intact bilaterally  V,VII: smile symmetric, facial light touch sensation equal VIII: hearing  grossly normal to voice  X: uvula elevates symmetrically  XI: bilateral shoulder shrug symmetric and strong XII: midline tongue extension without fassiculations Motor:  Normal tone. 5/5 strength of BUE and BLE major muscle groups including strong and equal grip strength and dorsiflexion/plantar flexion Sensory: light touch normal in all extremities.     ED Results / Procedures / Treatments   Labs (all labs ordered are listed, but only abnormal results are displayed) Labs Reviewed  COMPREHENSIVE METABOLIC PANEL - Abnormal; Notable for the following components:      Result Value   Potassium 5.9 (*)    Glucose, Bld 177 (*)    BUN 31 (*)    Creatinine, Ser 1.38 (*)    GFR calc non Af Amer 34 (*)    GFR calc Af Amer 39 (*)    All other components within normal limits  CBC WITH DIFFERENTIAL/PLATELET - Abnormal; Notable for the following components:   RBC 3.72 (*)    Hemoglobin 11.5 (*)    All other components within normal limits  I-STAT CHEM 8, ED - Abnormal; Notable for the following components:   BUN 29 (*)    Creatinine, Ser 1.10 (*)    Glucose, Bld 153 (*)    TCO2 19 (*)    Hemoglobin 11.2 (*)    HCT 33.0 (*)    All other components within normal limits  SARS  CORONAVIRUS 2 BY RT PCR (HOSPITAL ORDER, PERFORMED IN [facility])  MAGNESIUM  TSH  POTASSIUM  TROPONIN I (HIGH SENSITIVITY)  TROPONIN I (HIGH SENSITIVITY)    EKG EKG Interpretation  Date/Time:  Wednesday September 04 2020 15:49:54 EDT Ventricular Rate:  48 PR Interval:    QRS Duration: 72 QT Interval:  445 QTC Calculation: 398 R Axis:   31 Text Interpretation: Age not entered, assumed to be  86 years old for purpose of ECG interpretation Sinus bradycardia Abnormal R-wave progression, early transition diffuse ST elevation Confirmed by Pricilla Loveless [phone]) on 09/04/2020 3:54:10 PM      Radiology DG Chest Portable 1 View  Result Date: 09/04/2020 CLINICAL DATA:  Syncope EXAM: PORTABLE CHEST 1 VIEW COMPARISON:  03/23/2018 FINDINGS: Single frontal view of the chest demonstrates a stable cardiac silhouette. Stable atherosclerosis of the aortic arch. No airspace disease, effusion, or pneumothorax. No acute bony abnormalities. IMPRESSION: 1. No acute intrathoracic process. Electronically Signed   By: Sharlet Salina M.D.   On: 09/04/2020 16:39    Procedures Procedures (including critical care time)  CRITICAL CARE Performed by: Karrie Meres   Total critical care time: 33 minutes  Critical care time was exclusive of separately billable procedures and treating other patients.  Critical care was necessary to treat or prevent imminent or life-threatening deterioration.  Critical care was time spent personally by me on the following activities: development of treatment plan with patient and/or surrogate as well as nursing, discussions with consultants, evaluation of patient's response to treatment, examination of patient, obtaining history from patient or surrogate, ordering and performing treatments and interventions, ordering and review of laboratory studies, ordering and review of radiographic studies, pulse oximetry and re-evaluation of patient's  condition.   Medications Ordered in ED Medications  atropine 1 MG/10ML injection 0.5 mg (0.5 mg Intravenous Given 09/04/20 1719)    ED Course  I have reviewed the triage vital signs and the nursing notes.  Pertinent labs & imaging results that were available during my care of the patient were reviewed by me and considered in  my medical decision making (see chart for details).    MDM Rules/Calculators/A&P                          86 year old female presenting for evaluation due to concern for possible seizure-like activity.  On EMS arrival noted to be bradycardic and bradycardia down into the 20s with a several second pause.  Symptoms likely related to a syncopal episode related to her bradycardia she has persistent bradycardia on her arrival to the ED.  Reviewed/interpreted labs Chem 8 with marginally elevated BUN/creatinine however creatinine appears at baseline.  Bicarb slightly low at 19 and hemoglobin slightly low at 11 CBC without leukocytosis, mild anemia CMP with elevated potassium, elevated BUN/creatinine consistent with prior.  LFTs are normal Mg negative TSH normal Trop negative  EKG Age not entered, assumed to be  86 years old for purpose of ECG interpretation Sinus bradycardia Abnormal R-wave progression, early transition diffuse ST elevation  CXR reviewed/interpreted - No acute intrathoracic process.  4:15 PM Discussed with patients daughter in law about goals of care. She states pt would not want to be vented but she would want to have CPR, etc if needed.   5:03 PM alerted by nursing staff that patient had changes on cardiac monitor.  Reassessed patient, heart rate in 40s.  Daughter-in-law at bedside states patient's heart rate went to 0 and she had another syncopal episode.  Reviewed this on telemetry monitor and patient had prolonged sinus pause. (see picture above in EKG section). Will order atropine and reconsulted cardiology  5:07 PM CONSULT with Dr. Excell Seltzer with  cardiology who reviewed picture of tele monitor where pt had sinus pause. Cards will be down to see pt shortly.   6:23 PM CONSULT with Dr Mikeal Hawthorne who accepts patient for admission.   Final Clinical Impression(s) / ED Diagnoses Final diagnoses:  Symptomatic bradycardia    Rx / DC Orders ED Discharge Orders    None       Rayne Du 09/04/20 1828    Tegeler, Canary Brim, MD 09/04/20 [phone]

## 2020-09-04 NOTE — ED Triage Notes (Signed)
Pt bib ems from home after having eyes roll back and possible seizure like activity. bp 96/58 HR 40-44 with dizzy, now 120 sys, HX of dementia, DM, HTN  cbg 159 99% RA,  Dtr in law available

## 2020-09-04 NOTE — ED Notes (Signed)
Dr. Mikeal Hawthorne ( admitting MD ) notified on patient's elevated potasium result .

## 2020-09-04 NOTE — H&P (Signed)
History and Physical   Angelica Lane VHQ:[id number] DOB: 08/14/1928 DOA: 09/04/2020  Referring MD/NP/PA: Dr. Rush Landmarkegeler  PCP: Harvest ForestBakare, Mobolaji B, MD   Outpatient Specialists: None  Patient coming from: Home  Chief Complaint: Syncope  HPI: Angelica RightBetty Andrus is a 86 y.o. female with medical history significant of hypertension, hyperlipidemia, diabetes, dementia, generalized debility and depression who was brought in from home secondary to syncopal episode.  Patient apparently got up and felt unsteady dizzy.  She came to the ER where she was found to be profoundly bradycardic with heart rate in the 40s.  She is accompanied here by her daughter in low who was able to fill in some gaps.  Apparently in the ER patient was noted to have another episode of syncope.  She has remained unsteady and dizzy.  She currently has a temporary Visual merchandiserpace maker in place.  Cardiology consulted and patient being admitted to the hospital with symptomatic bradycardia.  Patient may likely get a pacemaker.  She is not agitated.  Denied any chest pain.  Denied any nausea vomiting or diarrhea..  ED Course: Temperature is 97.8 blood pressure 116/53 pulse 48 respirate 21 oxygen sat 98% room air.  Potassium is 6.4 hemoglobin 11.2 otherwise chemistry also within normal.  She has a BUN of 29 creatinine 1.1 calcium 10.0 glucose 153.  Hemoglobin A1c of 6.8 troponin of 8.  TSH 1.112.  Chest x-ray showed no acute findings.  Patient being admitted with symptomatic bradycardia.  Review of Systems: As per HPI otherwise 10 point review of systems negative.    Past Medical History:  Diagnosis Date  . Dementia (HCC)   . Depression   . Diabetes mellitus without complication (HCC)   . High cholesterol   . Hypertension     Past Surgical History:  Procedure Laterality Date  . APPENDECTOMY       reports that she has never smoked. She has never used smokeless tobacco. She reports previous alcohol use. She reports that she does not use  drugs.  No Known Allergies  Family History  Problem Relation Age of Onset  . Healthy Mother   . Healthy Father      Prior to Admission medications   Medication Sig Start Date End Date Taking? Authorizing Provider  amLODipine (NORVASC) 5 MG tablet Take 1 tablet (5 mg total) by mouth daily. Patient taking differently: Take 10 mg by mouth daily.  03/23/18  Yes Mancel BaleWentz, Elliott, MD  ammonium lactate (LAC-HYDRIN) 12 % lotion Apply 1 application topically every other day.  08/22/18  Yes [provider]  Calcium Carbonate-Vitamin D (CALCIUM 500/D PO) Take 500 mg by mouth daily.    Yes [provider]  carvedilol (COREG) 6.25 MG tablet Take 6.25 mg by mouth 2 (two) times daily with a meal.    Yes [provider]  cloNIDine (CATAPRES) 0.1 MG tablet Take 0.1 mg by mouth 2 (two) times daily.   Yes [provider]  donepezil (ARICEPT) 5 MG tablet Take 5 mg by mouth at bedtime.  08/01/18  Yes [provider]  escitalopram (LEXAPRO) 5 MG tablet Take 5 mg by mouth every morning.    Yes [provider]  ezetimibe (ZETIA) 10 MG tablet Take 10 mg by mouth daily.    Yes [provider]  ketoconazole (NIZORAL) 2 % cream Apply 1 application topically every other day.   Yes [provider]  losartan (COZAAR) 100 MG tablet Take 100 mg by mouth daily.    Yes [provider]  memantine (NAMENDA) 5 MG tablet Take 5 mg by mouth 2 (two) times daily.   Yes [provider]  metFORMIN (GLUCOPHAGE-XR) 500 MG 24 hr tablet Take 500 mg by mouth 2 (two) times daily.    Yes [provider]  Multiple Vitamins-Minerals (ONE-A-DAY 50 PLUS PO) Take 1 tablet by mouth daily.   Yes [provider]  OLANZapine (ZYPREXA) 2.5 MG tablet Take 2.5 mg by mouth every morning.    Yes [provider]  simvastatin (ZOCOR) 10 MG tablet Take 10 mg by mouth every evening.    Yes [provider]  triamcinolone ointment  (KENALOG) 0.1 % Apply 1 application topically 3 (three) times a week. 08/16/20  Yes [provider]  divalproex (DEPAKOTE SPRINKLE) 125 MG capsule divalproex 125 mg capsule,delayed release sprinkle twice a day  10/25/19  [provider]  metoprolol succinate (TOPROL-XL) 100 MG 24 hr tablet metoprolol succinate ER 200 mg tablet,extended release 24 hr in the morning  10/25/19  [provider]    Physical Exam: Vitals:   09/04/20 1541 09/04/20 1551 09/04/20 1624  BP:   (!) 126/57  Pulse: (!) 49    Resp: 17    Temp: 97.8 F (36.6 C)    TempSrc: Oral    SpO2: 99%    Weight:  49.4 kg   Height:  4\' 11"  (1.499 m)       Constitutional: Awake but confused, no distress Vitals:   09/04/20 1541 09/04/20 1551 09/04/20 1624  BP:   (!) 126/57  Pulse: (!) 49    Resp: 17    Temp: 97.8 F (36.6 C)    TempSrc: Oral    SpO2: 99%    Weight:  49.4 kg   Height:  4\' 11"  (1.499 m)    Eyes: PERRL, lids and conjunctivae normal ENMT: Mucous membranes are moist. Posterior pharynx clear of any exudate or lesions.Normal dentition.  Neck: normal, supple, no masses, no thyromegaly Respiratory: clear to auscultation bilaterally, no wheezing, no crackles. Normal respiratory effort. No accessory muscle use.  Cardiovascular: Sinus bradycardia, no murmurs / rubs / gallops. No extremity edema. 2+ pedal pulses. No carotid bruits.  Abdomen: no tenderness, no masses palpated. No hepatosplenomegaly. Bowel sounds positive.  Musculoskeletal: no clubbing / cyanosis. No joint deformity upper and lower extremities. Good ROM, no contractures. Normal muscle tone.  Skin: no rashes, lesions, ulcers. No induration Neurologic: CN 2-12 grossly intact. Sensation intact, DTR normal. Strength 5/5 in all 4.  Psychiatric: Slightly agitated, awake, oriented in person and place    Labs on Admission: I have personally reviewed following labs and imaging studies  CBC: Recent Labs  Lab 09/04/20 1621  09/04/20 1629  WBC 4.3  --   NEUTROABS 2.6  --   HGB 11.5* 11.2*  HCT 36.2 33.0*  MCV 97.3  --   PLT 276  --    Basic Metabolic Panel: Recent Labs  Lab 09/04/20 1621 09/04/20 1629  NA 139 141  K 5.9* 5.0  CL 107 110  CO2 22  --   GLUCOSE 177* 153*  BUN 31* 29*  CREATININE 1.38* 1.10*  CALCIUM 10.0  --   MG 1.8  --    GFR: Estimated Creatinine Clearance: 23.2 mL/min (A) (by C-G formula based on SCr of 1.1 mg/dL (H)). Liver Function Tests: Recent Labs  Lab 09/04/20 1621  AST 26  ALT 22  ALKPHOS 82  BILITOT 0.5  PROT 6.8  ALBUMIN 3.8   No  results for input(s): LIPASE, AMYLASE in the last 168 hours. No results for input(s): AMMONIA in the last 168 hours. Coagulation Profile: No results for input(s): INR, PROTIME in the last 168 hours. Cardiac Enzymes: No results for input(s): CKTOTAL, CKMB, CKMBINDEX, TROPONINI in the last 168 hours. BNP (last 3 results) No results for input(s): PROBNP in the last 8760 hours. HbA1C: No results for input(s): HGBA1C in the last 72 hours. CBG: No results for input(s): GLUCAP in the last 168 hours. Lipid Profile: No results for input(s): CHOL, HDL, LDLCALC, TRIG, CHOLHDL, LDLDIRECT in the last 72 hours. Thyroid Function Tests: Recent Labs    09/04/20 1621  TSH 1.112   Anemia Panel: No results for input(s): VITAMINB12, FOLATE, FERRITIN, TIBC, IRON, RETICCTPCT in the last 72 hours. Urine analysis:    Component Value Date/Time   LABSPEC 1.015 10/25/2019 1559   PHURINE 6.0 10/25/2019 1559   GLUCOSEU NEGATIVE 10/25/2019 1559   HGBUR NEGATIVE 10/25/2019 1559   BILIRUBINUR NEGATIVE 10/25/2019 1559   KETONESUR TRACE (A) 10/25/2019 1559   PROTEINUR 30 (A) 10/25/2019 1559   UROBILINOGEN 0.2 10/25/2019 1559   NITRITE NEGATIVE 10/25/2019 1559   LEUKOCYTESUR SMALL (A) 10/25/2019 1559   Sepsis Labs: @LABRCNTIP (procalcitonin:4,lacticidven:4) )No results found for this or any previous visit (from the past 240 hour(s)).    Radiological Exams on Admission: DG Chest Portable 1 View  Result Date: 09/04/2020 CLINICAL DATA:  Syncope EXAM: PORTABLE CHEST 1 VIEW COMPARISON:  03/23/2018 FINDINGS: Single frontal view of the chest demonstrates a stable cardiac silhouette. Stable atherosclerosis of the aortic arch. No airspace disease, effusion, or pneumothorax. No acute bony abnormalities. IMPRESSION: 1. No acute intrathoracic process. Electronically Signed   By: 05/23/2018 M.D.   On: 09/04/2020 16:39    EKG: Independently reviewed.  Sinus bradycardia with a rate of 48.  Abnormal R wave progression and diffuse ST elevation mainly early repolarization.  Assessment/Plan Principal Problem:   Symptomatic bradycardia Active Problems:   Dementia without behavioral disturbance (HCC)   Diabetes (HCC)   Essential hypertension     #1 symptomatic bradycardia: Patient will be admitted to monitored bed.  She will continue with a temporary pacer.  This plan for possible pacemaker placement.  We will discontinue her Aricept.  She is also on Namenda but not likely to be a contributing factor.  Continue per cardiology.  #2 Alzheimer's dementia: Stable at baseline.  Continue home regimen.  DC Aricept.  This could cause the bradycardia itself.  #3 hyperlipidemia: Continue with statin.  Continue with Zetia.  #4 essential hypertension: Continue with blood pressure control.  #5 diabetes: Sliding scale insulin.  Hold Metformin.  #6 generalized debility: PT and OT consult.   DVT prophylaxis: SCD Code Status: Full code Family Communication: Daughter-in-law in the room Disposition Plan: To be determined Consults called: Cardiology Dr. 09/06/2020 Admission status: Inpatient  Severity of Illness: The appropriate patient status for this patient is INPATIENT. Inpatient status is judged to be reasonable and necessary in order to provide the required intensity of service to ensure the patient's safety. The patient's presenting  symptoms, physical exam findings, and initial radiographic and laboratory data in the context of their chronic comorbidities is felt to place them at high risk for further clinical deterioration. Furthermore, it is not anticipated that the patient will be medically stable for discharge from the hospital within 2 midnights of admission. The following factors support the patient status of inpatient.   " The patient's presenting symptoms include dizziness. " The  worrisome physical exam findings include bradycardia. " The initial radiographic and laboratory data are worrisome because of EKG showing bradycardia. " The chronic co-morbidities include hypertension diabetes.   * I certify that at the point of admission it is my clinical judgment that the patient will require inpatient hospital care spanning beyond 2 midnights from the point of admission due to high intensity of service, high risk for further deterioration and high frequency of surveillance required.Lonia Blood MD [facility] Pager [phone]  If 7PM-7AM, please contact night-coverage [email or web address] Password [facility]  09/04/2020, 6:43 PM

## 2020-09-05 ENCOUNTER — Inpatient Hospital Stay (HOSPITAL_COMMUNITY): Payer: Medicare Other

## 2020-09-05 ENCOUNTER — Other Ambulatory Visit: Payer: Self-pay

## 2020-09-05 ENCOUNTER — Encounter (HOSPITAL_COMMUNITY): Payer: Self-pay | Admitting: Internal Medicine

## 2020-09-05 DIAGNOSIS — Z7189 Other specified counseling: Secondary | ICD-10-CM

## 2020-09-05 DIAGNOSIS — R55 Syncope and collapse: Secondary | ICD-10-CM

## 2020-09-05 DIAGNOSIS — Z66 Do not resuscitate: Secondary | ICD-10-CM

## 2020-09-05 DIAGNOSIS — I10 Essential (primary) hypertension: Secondary | ICD-10-CM

## 2020-09-05 DIAGNOSIS — Z789 Other specified health status: Secondary | ICD-10-CM

## 2020-09-05 DIAGNOSIS — Z515 Encounter for palliative care: Secondary | ICD-10-CM

## 2020-09-05 DIAGNOSIS — F0390 Unspecified dementia without behavioral disturbance: Secondary | ICD-10-CM

## 2020-09-05 DIAGNOSIS — R001 Bradycardia, unspecified: Secondary | ICD-10-CM

## 2020-09-05 HISTORY — DX: Bradycardia, unspecified: R00.1

## 2020-09-05 LAB — GLUCOSE, CAPILLARY: Glucose-Capillary: 182 mg/dL — ABNORMAL HIGH (ref 70–99)

## 2020-09-05 LAB — COMPREHENSIVE METABOLIC PANEL
ALT: 19 U/L (ref 0–44)
AST: 25 U/L (ref 15–41)
Albumin: 3.1 g/dL — ABNORMAL LOW (ref 3.5–5.0)
Alkaline Phosphatase: 63 U/L (ref 38–126)
Anion gap: 13 (ref 5–15)
BUN: 21 mg/dL (ref 8–23)
CO2: 16 mmol/L — ABNORMAL LOW (ref 22–32)
Calcium: 8 mg/dL — ABNORMAL LOW (ref 8.9–10.3)
Chloride: 111 mmol/L (ref 98–111)
Creatinine, Ser: 0.98 mg/dL (ref 0.44–1.00)
GFR calc Af Amer: 59 mL/min — ABNORMAL LOW (ref >60)
GFR calc non Af Amer: 51 mL/min — ABNORMAL LOW (ref >60)
Glucose, Bld: 155 mg/dL — ABNORMAL HIGH (ref 70–99)
Potassium: 3.2 mmol/L — ABNORMAL LOW (ref 3.5–5.1)
Sodium: 140 mmol/L (ref 135–145)
Total Bilirubin: 0.4 mg/dL (ref 0.3–1.2)
Total Protein: 5.6 g/dL — ABNORMAL LOW (ref 6.5–8.1)

## 2020-09-05 LAB — BASIC METABOLIC PANEL
Anion gap: 9 (ref 5–15)
Anion gap: 10 (ref 5–15)
BUN: 13 mg/dL (ref 8–23)
BUN: 16 mg/dL (ref 8–23)
CO2: 14 mmol/L — ABNORMAL LOW (ref 22–32)
CO2: 21 mmol/L — ABNORMAL LOW (ref 22–32)
Calcium: 6.9 mg/dL — ABNORMAL LOW (ref 8.9–10.3)
Calcium: 9.7 mg/dL (ref 8.9–10.3)
Chloride: 120 mmol/L — ABNORMAL HIGH (ref 98–111)
Chloride: 109 mmol/L (ref 98–111)
Creatinine, Ser: 0.63 mg/dL (ref 0.44–1.00)
Creatinine, Ser: 0.96 mg/dL (ref 0.44–1.00)
GFR calc Af Amer: >60 mL/min (ref >60)
GFR calc Af Amer: >60 mL/min (ref >60)
GFR calc non Af Amer: >60 mL/min (ref >60)
GFR calc non Af Amer: 52 mL/min — ABNORMAL LOW (ref >60)
Glucose, Bld: 100 mg/dL — ABNORMAL HIGH (ref 70–99)
Glucose, Bld: 163 mg/dL — ABNORMAL HIGH (ref 70–99)
Potassium: 2.8 mmol/L — ABNORMAL LOW (ref 3.5–5.1)
Potassium: 4.5 mmol/L (ref 3.5–5.1)
Sodium: 143 mmol/L (ref 135–145)
Sodium: 140 mmol/L (ref 135–145)

## 2020-09-05 LAB — CBC
HCT: 34.7 % — ABNORMAL LOW (ref 36.0–46.0)
Hemoglobin: 11 g/dL — ABNORMAL LOW (ref 12.0–15.0)
MCH: 31.6 pg (ref 26.0–34.0)
MCHC: 31.7 g/dL (ref 30.0–36.0)
MCV: 99.7 fL (ref 80.0–100.0)
Platelets: 226 10*3/uL (ref 150–400)
RBC: 3.48 MIL/uL — ABNORMAL LOW (ref 3.87–5.11)
RDW: 13.1 % (ref 11.5–15.5)
WBC: 6 10*3/uL (ref 4.0–10.5)
nRBC: 0 % (ref 0.0–0.2)

## 2020-09-05 LAB — CBG MONITORING, ED
Glucose-Capillary: 172 mg/dL — ABNORMAL HIGH (ref 70–99)
Glucose-Capillary: 157 mg/dL — ABNORMAL HIGH (ref 70–99)
Glucose-Capillary: 123 mg/dL — ABNORMAL HIGH (ref 70–99)

## 2020-09-05 LAB — ECHOCARDIOGRAM COMPLETE
Area-P 1/2: 2.62 cm2
Calc EF: 61.3 %
Height: 59 in
S' Lateral: 3.2 cm
Single Plane A2C EF: 66.8 %
Single Plane A4C EF: 57.3 %
Weight: 1744 oz

## 2020-09-05 MED ORDER — POTASSIUM CHLORIDE CRYS ER 20 MEQ PO TBCR
40.0000 meq | EXTENDED_RELEASE_TABLET | Freq: Once | ORAL | Status: AC
  Administered 2020-09-05: 40 meq via ORAL
  Filled 2020-09-05: qty 2

## 2020-09-05 MED ORDER — POTASSIUM CHLORIDE 10 MEQ/100ML IV SOLN
10.0000 meq | INTRAVENOUS | Status: AC
  Administered 2020-09-05 (×3): 10 meq via INTRAVENOUS
  Filled 2020-09-05 (×3): qty 100

## 2020-09-05 MED ORDER — INFLUENZA VAC A&B SA ADJ QUAD 0.5 ML IM PRSY
0.5000 mL | PREFILLED_SYRINGE | INTRAMUSCULAR | Status: DC
  Filled 2020-09-05: qty 0.5

## 2020-09-05 NOTE — Consult Note (Signed)
Consultation Note Date: 09/05/2020   Patient Name: Angelica Lane  DOB: [birth year]  MRN: [id number]  Age / Sex: 86 y.o., female  PCP: Audley Hose, MD Referring Physician: Antonieta Pert, MD  Reason for Consultation: Establishing goals of care  HPI/Patient Profile: 86 y.o. female  with past medical history of dementia, depression, diabetes, hyperlipidemia, and hypertension presented to the ED on 09/04/2020 for evaluation of syncopal episode and bradycardia. While on telemetry monitoring in ED, patient experienced another syncopal episode. Review of telemetry showed that the patient experienced a prolonged sinus pause. Cardiology consult was placed and she was placed for admission on 09/05/11.   Patient and family face treatment option decisions, advanced directive decisions, and anticipatory care needs.   Clinical Assessment and Goals of Care: I have reviewed medical records including EPIC notes, labs, and imaging. Received report from primary RN - no acute concerns.  Went to visit patient at bedside - son/Angelica Lane was present. Patient was lying in bed awake, alert, oriented to self only - she was not able to participate in complex medical decision making conversation. She denied pain. No signs or non-verbal gestures of pain or discomfort noted. No respiratory distress, increased work of breathing, or secretions noted.  Met with son/Angelica Lane in person and his wife Angelica Lane via speakerphone  to discuss diagnosis, prognosis, GOC, EOL wishes, disposition, and options.  I introduced Palliative Medicine as specialized medical care for people living with serious illness. It focuses on providing relief from the symptoms and stress of a serious illness. The goal is to improve quality of life for both the patient and the family.  We discussed a brief life review of the patient as well as functional and nutritional status. The  patient is widowed. She had two children, one daughter and one son. Her daughter sadly has passed, which leaves Angelica Lane as the only surviving child. The patient has been living with Angelica Lane for the past 3 years at his home. He states that he has noticed a gradual decline in her physical and cognitive functioning about 1.5 years ago, but a noticeable decline right before summer started. The patient is no longer able to perform ADLs, ambulate without assistance, feed herself, cannot cook/prepare meals, and need help with her medication management. She recently has experienced several falls as well. Despite her needing assistance with feeding, the family reports her appetite has been well and her oral intake has been adequate.   The patient experienced a fall two weeks ago - the family set up HHPT/OT and tried to get home health aid assistance - they report that this has not fully been the help they need to care for the patient.   We discussed patient's current illness and what it means in the larger context of patient's on-going co-morbidities. The family had a clear understanding of the patient's current medical situation. Natural disease trajectory and expectations at EOL were discussed. I attempted to elicit values and goals of care important to the patient.  Currently, the family is wanting watchful waiting  during beta blocker washout - they want to see if she will improve. Discussion was had around the possible need for a pacemaker - the family expressed uncertainty if they would pursue this option and wanted more time to discuss with cardiology.  The difference between aggressive medical intervention and comfort care was considered in light of the patient's goals of care. I introduced the concept of hospice philosophy. Hospice and Palliative Care services outpatient were explained and offered.  Concepts specific to code status and rehospitalization were considered and discussed. Encouraged patient/family to  consider DNR/DNI status understanding evidenced based poor outcomes in similar hospitalized patient, as the cause of arrest is likely associated with advanced chronic illness/age rather than an easily reversible acute cardio-pulmonary event. Family was agreeable to DNR/DNI with understanding that she would not receive CPR, defibrillation, ACLS medications, or intubation.  Family expressed they were no longer able to meet the needs for the patient any longer in their home - they felt they were not able to provide the care she needs to stay safe. Discussed disposition options. The family is interested in the patient discharging to LTCF. They asked if it was possible she be placed at [facility] in [place], [place] - this is where her sister currently lives and they would like for them to be close together. The family states the majority of the patient's family lives in [place] and feel she would have more visitors and support there. If the patient is not able to be accepted into that specific facility, they are interested in learning about others in the same city or [employer].   Discussed with family the importance of continued conversation with each other and the medical providers regarding overall plan of care and treatment options, ensuring decisions are within the context of the patient's values and GOCs.    Questions and concerns were addressed. The family was encouraged to call with questions or concerns. PMT card was provided.  Primary Decision Maker: NEXT OF KIN Angelica Lane - son    SUMMARY OF RECOMMENDATIONS  Continue current full scope medical treatment  Initiated DNR/DNI - golden form completed and placed in shadow chart, copy was made and will be scanned into [employer]  Family would like watchful waiting - want to see if coreg washout improves her symptoms  If washout does not improve symptoms, family would like to get more input from electrophysiology if  pacemaker is a reasonable option  Family wants patient discharge to LTC. They would like placement at [facility] in [place], [place] - this is where the patient's sister currently lives. The majority of the patient's family lives in this area as well.  TOC consult placed for LTC placement as outlined above on discharge  The family is considering hospice support vs outpatient palliative care on discharge  PMT will continue to follow holistically  Code Status/Advance Care Planning:  DNR   Palliative Prophylaxis:   Aspiration, Bowel Regimen, Delirium Protocol, Frequent Pain Assessment, Oral Care and Turn Reposition  Additional Recommendations (Limitations, Scope, Preferences):  Full Scope Treatment  Psycho-social/Spiritual:   Created space and opportunity for patient and family to express thoughts and feelings regarding patient's current medical situation.   Emotional support provided.  Prognosis:   Unable to determine  Discharge Planning: To Be Determined      Primary Diagnoses: Present on Admission: . Symptomatic bradycardia . Dementia without behavioral disturbance ([place]) . Essential hypertension   I have reviewed the  medical record, interviewed the patient and family, and examined the patient. The following aspects are pertinent.  Past Medical History:  Diagnosis Date  . Dementia ([place])   . Depression   . Diabetes mellitus without complication ([place])   . High cholesterol   . Hypertension    Social History   Socioeconomic History  . Marital status: Single    Spouse name: Not on file  . Number of children: Not on file  . Years of education: Not on file  . Highest education level: Not on file  Occupational History  . Not on file  Tobacco Use  . Smoking status: Never Smoker  . Smokeless tobacco: Never Used  Substance and Sexual Activity  . Alcohol use: Not Currently  . Drug use: Never  . Sexual activity: Not on file  Other Topics  Concern  . Not on file  Social History Narrative  . Not on file   Social Determinants of Health   Financial Resource Strain:   . Difficulty of Paying Living Expenses: Not on file  Food Insecurity:   . Worried About Charity fundraiser in the Last Year: Not on file  . Ran Out of Food in the Last Year: Not on file  Transportation Needs:   . Lack of Transportation (Medical): Not on file  . Lack of Transportation (Non-Medical): Not on file  Physical Activity:   . Days of Exercise per Week: Not on file  . Minutes of Exercise per Session: Not on file  Stress:   . Feeling of Stress : Not on file  Social Connections:   . Frequency of Communication with Friends and Family: Not on file  . Frequency of Social Gatherings with Friends and Family: Not on file  . Attends Religious Services: Not on file  . Active Member of Clubs or Organizations: Not on file  . Attends Archivist Meetings: Not on file  . Marital Status: Not on file   Family History  Problem Relation Age of Onset  . Healthy Mother   . Healthy Father    Scheduled Meds: . insulin aspart  0-5 Units Subcutaneous QHS  . insulin aspart  0-9 Units Subcutaneous TID WC  . sodium chloride flush  3 mL Intravenous Q12H   Continuous Infusions: . sodium chloride 50 mL/hr at 09/05/20 1207   PRN Meds:.acetaminophen **OR** acetaminophen, ondansetron **OR** ondansetron (ZOFRAN) IV Medications Prior to Admission:  Prior to Admission medications   Medication Sig Start Date End Date Taking? Authorizing Provider  amLODipine (NORVASC) 5 MG tablet Take 1 tablet (5 mg total) by mouth daily. Patient taking differently: Take 10 mg by mouth daily.  03/23/18  Yes Daleen Bo, MD  ammonium lactate (LAC-HYDRIN) 12 % lotion Apply 1 application topically every other day.  08/22/18  Yes [provider]  Calcium Carbonate-Vitamin D (CALCIUM 500/D PO) Take 500 mg by mouth daily.    Yes [provider]  carvedilol (COREG) 6.25  MG tablet Take 6.25 mg by mouth 2 (two) times daily with a meal.    Yes [provider]  cloNIDine (CATAPRES) 0.1 MG tablet Take 0.1 mg by mouth 2 (two) times daily.   Yes [provider]  donepezil (ARICEPT) 5 MG tablet Take 5 mg by mouth at bedtime.  08/01/18  Yes [provider]  escitalopram (LEXAPRO) 5 MG tablet Take 5 mg by mouth every morning.    Yes [provider]  ezetimibe (ZETIA) 10 MG tablet Take 10 mg by  mouth daily.    Yes [provider]  ketoconazole (NIZORAL) 2 % cream Apply 1 application topically every other day.   Yes [provider]  losartan (COZAAR) 100 MG tablet Take 100 mg by mouth daily.    Yes [provider]  memantine (NAMENDA) 5 MG tablet Take 5 mg by mouth 2 (two) times daily.   Yes [provider]  metFORMIN (GLUCOPHAGE-XR) 500 MG 24 hr tablet Take 500 mg by mouth 2 (two) times daily.    Yes [provider]  Multiple Vitamins-Minerals (ONE-A-DAY 50 PLUS PO) Take 1 tablet by mouth daily.   Yes [provider]  OLANZapine (ZYPREXA) 2.5 MG tablet Take 2.5 mg by mouth every morning.    Yes [provider]  simvastatin (ZOCOR) 10 MG tablet Take 10 mg by mouth every evening.    Yes [provider]  triamcinolone ointment (KENALOG) 0.1 % Apply 1 application topically 3 (three) times a week. 08/16/20  Yes [provider]  divalproex (DEPAKOTE SPRINKLE) 125 MG capsule divalproex 125 mg capsule,delayed release sprinkle twice a day  10/25/19  [provider]  metoprolol succinate (TOPROL-XL) 100 MG 24 hr tablet metoprolol succinate ER 200 mg tablet,extended release 24 hr in the morning  10/25/19  [provider]   No Known Allergies Review of Systems  Constitutional: Positive for activity change and fatigue. Negative for appetite change.  Respiratory: Negative for cough and shortness of breath.   Neurological: Negative for dizziness, speech  difficulty and light-headedness.  All other systems reviewed and are negative.   Physical Exam Vitals and nursing note reviewed.  Constitutional:      General: She is not in acute distress. Pulmonary:     Effort: No respiratory distress.  Skin:    General: Skin is warm and dry.  Neurological:     Mental Status: She is alert. Mental status is at baseline. She is disoriented and confused.     Motor: Weakness present.  Psychiatric:        Attention and Perception: Attention normal.        Behavior: Behavior is cooperative.        Cognition and Memory: Cognition is impaired. Memory is impaired.     Vital Signs: BP (!) 170/94   Pulse 75   Temp 98 F (36.7 C) (Oral)   Resp 17   Ht _0  (1.499 m)   Wt 49.4 kg   SpO2 100%   BMI 22.02 kg/m  Pain Scale: 0-10   Pain Score: 0-No pain   SpO2: SpO2: 100 % O2 Device:SpO2: 100 % O2 Flow Rate: .   IO: Intake/output summary:   Intake/Output Summary (Last 24 hours) at 09/05/2020 1419 Last data filed at 09/05/2020 1207 Gross per 24 hour  Intake 758.38 ml  Output --  Net 758.38 ml    LBM:   Baseline Weight: Weight: 49.4 kg Most recent weight: Weight: 49.4 kg     Palliative Assessment/Data: PPS 30%     Time In: 1900 Time Out: 2020 Time Total: 80 minutes  Greater than 50%  of this time was spent counseling and coordinating care related to the above assessment and plan.  Signed by: Lin Landsman, NP   Please contact Palliative Medicine Team phone at [phone] for questions and concerns.  For individual provider: See Shea Evans

## 2020-09-05 NOTE — ED Notes (Signed)
Pt HR dropped to 27. Radial pulse 60 when checked. Pt currently at 80. PA notified.

## 2020-09-05 NOTE — Plan of Care (Signed)
  Problem: Education: Goal: Knowledge of General Education information will improve Description Including pain rating scale, medication(s)/side effects and non-pharmacologic comfort measures Outcome: Progressing   Problem: Education: Goal: Knowledge of General Education information will improve Description Including pain rating scale, medication(s)/side effects and non-pharmacologic comfort measures Outcome: Progressing   Problem: Pain Managment: Goal: General experience of comfort will improve Outcome: Progressing   

## 2020-09-05 NOTE — Progress Notes (Addendum)
Electrophysiology Rounding Note  Patient Name: Angelica Lane Date of Encounter: 09/05/2020  Primary Cardiologist: No primary care provider on file. Electrophysiologist: New   Subjective   Stable this am. Pt with sitter. She is out on a hall bed at time of our assessment and unfortunately has been disconnected from [facility]. We have no data currently, but it should resume once she is reconnected.   Lowest HR overnight reported 37 bpm, remained awake and agitated per RN.    Inpatient Medications    Scheduled Meds: . insulin aspart  0-5 Units Subcutaneous QHS  . insulin aspart  0-9 Units Subcutaneous TID WC  . sodium chloride flush  3 mL Intravenous Q12H   Continuous Infusions: . sodium chloride 50 mL/hr at 09/04/20 2042   PRN Meds: acetaminophen **OR** acetaminophen, ondansetron **OR** ondansetron (ZOFRAN) IV   Vital Signs    Vitals:   09/04/20 2300 09/05/20 0100 09/05/20 0115 09/05/20 0145  BP: (!) 116/53 (!) 146/50 (!) 169/55 (!) 156/57  Pulse: (!) 50 (!) 53    Resp: 15 18 18 14   Temp:      TempSrc:      SpO2: 100%   100%  Weight:      Height:       No intake or output data in the 24 hours ending 09/05/20 0734 Filed Weights   09/04/20 1551  Weight: 49.4 kg    Physical Exam    GEN- The patient is elderly and cachectic appearing, alert to person  Head- normocephalic, atraumatic Eyes-  Sclera clear, conjunctiva pink Ears- hearing intact Oropharynx- clear Neck- supple Lungs- Clear to ausculation bilaterally, normal work of breathing Heart- Slow rate and regular rhythm, no murmurs, rubs or gallops GI- soft, NT, ND, + BS Extremities- no clubbing or cyanosis. No edema Skin- no rash or lesion Psych-  Flat but appropriate affect Neuro- strength and sensation are intact  Labs    CBC Recent Labs    09/04/20 1621 09/04/20 1621 09/04/20 1629 09/05/20 0500  WBC 4.3  --   --  6.0  NEUTROABS 2.6  --   --   --   HGB 11.5*   < > 11.2* 11.0*  HCT 36.2   < > 33.0*  34.7*  MCV 97.3  --   --  99.7  PLT 276  --   --  226   < > = values in this interval not displayed.   Basic Metabolic Panel Recent Labs    09/07/20 1621 09/04/20 1621 09/04/20 1629 09/04/20 1629 09/04/20 1936 09/05/20 0500  NA 139   < > 141  --   --  140  K 5.9*   < > 5.0   < > 6.4* 3.2*  CL 107   < > 110  --   --  111  CO2 22  --   --   --   --  16*  GLUCOSE 177*   < > 153*  --   --  155*  BUN 31*   < > 29*  --   --  21  CREATININE 1.38*   < > 1.10*  --   --  0.98  CALCIUM 10.0  --   --   --   --  8.0*  MG 1.8  --   --   --   --   --    < > = values in this interval not displayed.   Liver Function Tests Recent Labs    09/04/20 1621  09/05/20 0500  AST 26 25  ALT 22 19  ALKPHOS 82 63  BILITOT 0.5 0.4  PROT 6.8 5.6*  ALBUMIN 3.8 3.1*   No results for input(s): LIPASE, AMYLASE in the last 72 hours. Cardiac Enzymes No results for input(s): CKTOTAL, CKMB, CKMBINDEX, TROPONINI in the last 72 hours.   Telemetry    Currently disconnected (personally reviewed) HRs by vitals 50-60s, occasionally dipping into 40s, lowest 37.   Radiology    DG Chest Portable 1 View  Result Date: 09/04/2020 CLINICAL DATA:  Syncope EXAM: PORTABLE CHEST 1 VIEW COMPARISON:  03/23/2018 FINDINGS: Single frontal view of the chest demonstrates a stable cardiac silhouette. Stable atherosclerosis of the aortic arch. No airspace disease, effusion, or pneumothorax. No acute bony abnormalities. IMPRESSION: 1. No acute intrathoracic process. Electronically Signed   By: Sharlet Salina M.D.   On: 09/04/2020 16:39    Patient Profile     Angelica Lane is a 86 y.o. female with a past medical history significant for moderate dementia and falls.  she was admitted for syncope.   Assessment & Plan    1. SYNCOPE 2. Sinus arrest Telemetry currently disconnected and unable to review. Will continue to follow conservatively. If she has further significant bradycardia after coreg wash out, will likely need  pacing. If does not declare herself, would consider loop recorder  3. Hyperkalemia Received kayexalate with large soft BM.  K 5.9 -recheck> 5.0 -recheck> 6.4 -kayexalate> 3.2.  K supp ordered at 40 meq. Follow closely. Repeat BMET this afternoon.   4. Renal insufficiency Cr 0.98 this am.   5. Anemia Hgb 11.0. Per primary.   6. Dementia Will have palliative care see for goals of care Despite dementia, she does remember being lightheaded prior to her syncopal event.   7. Social stresses see below Dr. Graciela Husbands spoke with daughter-in-law personally. They are going to discuss goals of care. Palliative care team has been consulted as well.     For questions or updates, please contact [facility] Please consult [email or web address] for contact info under Cardiology/STEMI.  Signed, Graciella Freer, PA-C  09/05/2020, 7:34 AM

## 2020-09-05 NOTE — Progress Notes (Signed)
  Pt continues to have occasional bradycardia. Into the 20s at times. Lying in bed at rest and asymptomatic.   Discussed with Dr. Graciela Husbands, Asymptomatic. Continue to allow coreg to wash out and follow for plan pending course as BB washes out.   Casimiro Needle [address]" [place], [place]  09/05/2020 2:47 PM

## 2020-09-05 NOTE — Progress Notes (Signed)
PROGRESS NOTE    Angelica Lane  WJX:[id number] DOB: [birth year] DOA: 09/04/2020 PCP: [place], MD   Chief Complaint  Patient presents with  . Bradycardia   Brief Narrative: Per HPI: 86 y.o. female with medical history significant of hypertension, hyperlipidemia, diabetes, dementia, generalized debility and depression admitted secondary to syncopal episode versus uncontrolled, felt unsteady and dizzy.  History of fall in the past thought to be secondary to orthostasis.  She is on carvedilol and clonidine for hypertension. She had fall about 2 weeks ago with facial trauma and was seen at [facility].  ED Course: Temperature is 97.8 blood pressure 116/53 pulse 48 respirate 21 oxygen sat 98% room air.  Potassium is 6.4 hemoglobin 11.2 otherwise chemistry also within normal.  She has a BUN of 29 creatinine 1.1 calcium 10.0 glucose 153.  Hemoglobin A1c of 6.8 troponin of 8.  TSH 1.112.  Chest x-ray showed no acute findings.  Patient being admitted with symptomatic bradycardia.  Subjective: Alert,awake HR in72 Feeling cold Denies any pain , vomiting. Oriented to place, month not to year or current president ( says Ardyth Harps). reports she lives with her son  heart rate is in 40s to 50s overnight, blood pressure 140s to 160s.  Assessment & Plan:  Symptomatic bradycardia with syncope/sinus arrest: history of recurrent fall, found to have sinus node dysfunction/sinus arrest in the setting of carvedilol clonidine Namenda and Aricept. Got AtropineX1 in ED. EP cardiology on board-and per plan carvedilol is on hold and being monitored on telemetry.  Further plan per cardiology.  Aricept stopped.  Dementia without behavioral disturbance: mentation appears stable,Oriented to self, people place, month not to year. Cont supportive care.  Aricept discontinued.  Palliative care consulted.  Hyperlipidemia continue statin/Zetia  Diabetes mellitus, controlled hbA1c 6.8.  Blood sugar is stable on  sliding scale. Recent Labs  Lab 09/04/20 2132 09/05/20 0538  GLUCAP 178* 172*   Lab Results  Component Value Date   HGBA1C 6.8 (H) 09/04/2020   Essential hypertension: Blood pressure on higher side, carvedilol on hold.  Monitor.  Hyperkalemia resolved and potassium low being replaced.  Status post Kayexalate yesterday.  Generalized debility: PT OT eval  GOC: Palliative care consulted in the setting of sinus arrest/syncope , old age and dementia.  DVT prophylaxis: SCDs Start: 09/04/20 1952 Code Status:   Code Status: Full Code I spoke with patient's son and daughter-in-law and son stated that patient wanted full code, and I am not sure how much patient understood. Palliative care has been consulted they are leaning towards DNR. Family Communication: plan of care discussed with patient at bedside.  Status is: Inpatient Remains inpatient appropriate because:Inpatient level of care appropriate due to severity of illness and Due to symptomatic bradycardia and syncope   Dispo: The patient is from: Home lives w/ family              Anticipated d/c is to: TBD              Anticipated d/c date is: 2 days              Patient currently is not medically stable to d/c.  Nutrition: Diet Order            Diet heart healthy/carb modified Room service appropriate? Yes; Fluid consistency: Thin  Diet effective now                  Body mass index is 22.02 kg/m.  Consultants:see note  Procedures:see  note Microbiology:see note Blood Culture    Component Value Date/Time   SDES URINE, RANDOM 10/25/2019 1602   SPECREQUEST  10/25/2019 1602    NONE Performed at [facility], [address], [place], [place] [address]    CULT MULTIPLE SPECIES PRESENT, SUGGEST RECOLLECTION (A) 10/25/2019 1602   REPTSTATUS 10/26/2019 FINAL 10/25/2019 1602    Other culture-see note  Medications: Scheduled Meds: . insulin aspart  0-5 Units Subcutaneous QHS  . insulin aspart  0-9 Units  Subcutaneous TID WC  . potassium chloride  40 mEq Oral Once  . sodium chloride flush  3 mL Intravenous Q12H   Continuous Infusions: . sodium chloride 50 mL/hr at 09/04/20 2042    Antimicrobials: Anti-infectives (From admission, onward)   None     Objective: Vitals: Today's Vitals   09/04/20 2300 09/05/20 0100 09/05/20 0115 09/05/20 0145  BP: (!) 116/53 (!) 146/50 (!) 169/55 (!) 156/57  Pulse: (!) 50 (!) 53    Resp: 15 18 18 14   Temp:      TempSrc:      SpO2: 100%   100%  Weight:      Height:      PainSc:       No intake or output data in the 24 hours ending 09/05/20 0735 Filed Weights   09/04/20 1551  Weight: 49.4 kg   Weight change:   Intake/Output from previous day: No intake/output data recorded. Intake/Output this shift: No intake/output data recorded.  Examination:  General exam: AAOx2,weak appearing. Elderly, RA. HEENT:Oral mucosa moist, Ear/Nose WNL grossly,dentition normal. Respiratory system: bilaterally clear,no wheezing or crackles,no use of accessory muscle, non tender. Cardiovascular system: S1 & S2 +, regular, No JVD. Gastrointestinal system: Abdomen soft, NT,ND, BS+. Nervous System:Alert, awake, oriented to self and place, moving extremities and grossly nonfocal Extremities: No edema, distal peripheral pulses palpable.  Skin: chronic hyperpigmentation skin rashes,no icterus. MSK: Normal muscle bulk,tone, power  Data Reviewed: I have personally reviewed following labs and imaging studies CBC: Recent Labs  Lab 09/04/20 1621 09/04/20 1629 09/05/20 0500  WBC 4.3  --  6.0  NEUTROABS 2.6  --   --   HGB 11.5* 11.2* 11.0*  HCT 36.2 33.0* 34.7*  MCV 97.3  --  99.7  PLT 276  --  226   Basic Metabolic Panel: Recent Labs  Lab 09/04/20 1621 09/04/20 1629 09/04/20 1936 09/05/20 0500  NA 139 141  --  140  K 5.9* 5.0 6.4* 3.2*  CL 107 110  --  111  CO2 22  --   --  16*  GLUCOSE 177* 153*  --  155*  BUN 31* 29*  --  21  CREATININE 1.38* 1.10*   --  0.98  CALCIUM 10.0  --   --  8.0*  MG 1.8  --   --   --    GFR: Estimated Creatinine Clearance: 26 mL/min (by C-G formula based on SCr of 0.98 mg/dL). Liver Function Tests: Recent Labs  Lab 09/04/20 1621 09/05/20 0500  AST 26 25  ALT 22 19  ALKPHOS 82 63  BILITOT 0.5 0.4  PROT 6.8 5.6*  ALBUMIN 3.8 3.1*   No results for input(s): LIPASE, AMYLASE in the last 168 hours. No results for input(s): AMMONIA in the last 168 hours. Coagulation Profile: No results for input(s): INR, PROTIME in the last 168 hours. Cardiac Enzymes: No results for input(s): CKTOTAL, CKMB, CKMBINDEX, TROPONINI in the last 168 hours. BNP (last 3 results) No results for input(s):  PROBNP in the last 8760 hours. HbA1C: Recent Labs    09/04/20 1952  HGBA1C 6.8*   CBG: Recent Labs  Lab 09/04/20 2132 09/05/20 0538  GLUCAP 178* 172*   Lipid Profile: No results for input(s): CHOL, HDL, LDLCALC, TRIG, CHOLHDL, LDLDIRECT in the last 72 hours. Thyroid Function Tests: Recent Labs    09/04/20 1621  TSH 1.112   Anemia Panel: No results for input(s): VITAMINB12, FOLATE, FERRITIN, TIBC, IRON, RETICCTPCT in the last 72 hours. Sepsis Labs: No results for input(s): PROCALCITON, LATICACIDVEN in the last 168 hours.  Recent Results (from the past 240 hour(s))  SARS Coronavirus 2 by RT PCR (hospital order, performed in [facility] hospital lab) Nasopharyngeal Nasopharyngeal Swab     Status: None   Collection Time: 09/04/20  6:29 PM   Specimen: Nasopharyngeal Swab  Result Value Ref Range Status   SARS Coronavirus 2 NEGATIVE NEGATIVE Final    Comment: (NOTE) SARS-CoV-2 target nucleic acids are NOT DETECTED.  The SARS-CoV-2 RNA is generally detectable in upper and lower respiratory specimens during the acute phase of infection. The lowest concentration of SARS-CoV-2 viral copies this assay can detect is 250 copies / mL. A negative result does not preclude SARS-CoV-2 infection and should not be used as  the sole basis for treatment or other patient management decisions.  A negative result may occur with improper specimen collection / handling, submission of specimen other than nasopharyngeal swab, presence of viral mutation(s) within the areas targeted by this assay, and inadequate number of viral copies (<250 copies / mL). A negative result must be combined with clinical observations, patient history, and epidemiological information.  Fact Sheet for Patients:   BoilerBrush.com.cy  Fact Sheet for Healthcare Providers: https://pope.com/  This test is not yet approved or  cleared by the [place] FDA and has been authorized for detection and/or diagnosis of SARS-CoV-2 by FDA under an Emergency Use Authorization (EUA).  This EUA will remain in effect (meaning this test can be used) for the duration of the COVID-19 declaration under Section 564(b)(1) of the Act, 21 U.S.C. section 360bbb-3(b)(1), unless the authorization is terminated or revoked sooner.  Performed at [facility], [address], [place], [place] [address]      Radiology Studies: DG Chest Portable 1 View  Result Date: 09/04/2020 CLINICAL DATA:  Syncope EXAM: PORTABLE CHEST 1 VIEW COMPARISON:  03/23/2018 FINDINGS: Single frontal view of the chest demonstrates a stable cardiac silhouette. Stable atherosclerosis of the aortic arch. No airspace disease, effusion, or pneumothorax. No acute bony abnormalities. IMPRESSION: 1. No acute intrathoracic process. Electronically Signed   By: Sharlet Salina M.D.   On: 09/04/2020 16:39     LOS: 1 day   Lanae Boast, MD [facility]  09/05/2020, 7:35 AM

## 2020-09-05 NOTE — ED Notes (Signed)
Pt moved to nursing station for 1 to 1 staff observation, pt making continuous attempts to get out of bed. Pt provided warm blankets and repositioned to most comfortable position.

## 2020-09-05 NOTE — Progress Notes (Signed)
  Echocardiogram 2D Echocardiogram has been performed.  Angelica Lane 09/05/2020, 11:54 AM

## 2020-09-05 NOTE — ED Notes (Signed)
Pt continuously scooting down to end of bed, after countless attempts to redirect pt, this RN repositioned pt in most comfortable position. Bed rails up x2 for pt safety and placed in [place] position

## 2020-09-05 NOTE — ED Notes (Signed)
Pt has large soft BM, pt found on edge of bed by staff. Candy, RN able to redirect pt, pt provided peri care and reposition in safe position. Order for safety sitter placed.

## 2020-09-05 NOTE — ED Notes (Signed)
Dentures cleaned and placed in denture cup.

## 2020-09-06 ENCOUNTER — Inpatient Hospital Stay (HOSPITAL_COMMUNITY)
Admission: EM | Disposition: A | Discharge status: Skilled Nursing Facility | Payer: Self-pay | Source: Home / Self Care | Attending: Internal Medicine

## 2020-09-06 DIAGNOSIS — I495 Sick sinus syndrome: Secondary | ICD-10-CM

## 2020-09-06 DIAGNOSIS — Z66 Do not resuscitate: Secondary | ICD-10-CM

## 2020-09-06 DIAGNOSIS — R531 Weakness: Secondary | ICD-10-CM

## 2020-09-06 DIAGNOSIS — Z950 Presence of cardiac pacemaker: Secondary | ICD-10-CM

## 2020-09-06 DIAGNOSIS — Z789 Other specified health status: Secondary | ICD-10-CM

## 2020-09-06 DIAGNOSIS — R001 Bradycardia, unspecified: Secondary | ICD-10-CM

## 2020-09-06 DIAGNOSIS — Z7189 Other specified counseling: Secondary | ICD-10-CM

## 2020-09-06 DIAGNOSIS — Z515 Encounter for palliative care: Secondary | ICD-10-CM

## 2020-09-06 HISTORY — PX: PACEMAKER IMPLANT: EP1218

## 2020-09-06 LAB — GLUCOSE, CAPILLARY
Glucose-Capillary: 118 mg/dL — ABNORMAL HIGH (ref 70–99)
Glucose-Capillary: 168 mg/dL — ABNORMAL HIGH (ref 70–99)
Glucose-Capillary: 126 mg/dL — ABNORMAL HIGH (ref 70–99)
Glucose-Capillary: 211 mg/dL — ABNORMAL HIGH (ref 70–99)

## 2020-09-06 LAB — SURGICAL PCR SCREEN
MRSA, PCR: NEGATIVE
Staphylococcus aureus: POSITIVE — AB

## 2020-09-06 SURGERY — PACEMAKER IMPLANT
Anesthesia: LOCAL

## 2020-09-06 MED ORDER — SODIUM CHLORIDE 0.9 % IV SOLN: INTRAVENOUS | Status: DC

## 2020-09-06 MED ORDER — CHLORHEXIDINE GLUCONATE 4 % EX LIQD
60.0000 mL | Freq: Once | CUTANEOUS | Status: AC
  Administered 2020-09-06: 4 via TOPICAL

## 2020-09-06 MED ORDER — IOHEXOL 350 MG/ML SOLN
INTRAVENOUS | Status: DC | PRN
  Administered 2020-09-06: 10 mL

## 2020-09-06 MED ORDER — SODIUM CHLORIDE 0.9 % IV SOLN
INTRAVENOUS | Status: AC
  Filled 2020-09-06: qty 2

## 2020-09-06 MED ORDER — POTASSIUM CHLORIDE 10 MEQ/100ML IV SOLN
10.0000 meq | INTRAVENOUS | Status: AC
  Administered 2020-09-06: 10 meq via INTRAVENOUS
  Filled 2020-09-06: qty 100

## 2020-09-06 MED ORDER — LIDOCAINE HCL (PF) 1 % IJ SOLN
INTRAMUSCULAR | Status: DC | PRN
  Administered 2020-09-06: 40 mL

## 2020-09-06 MED ORDER — CHLORHEXIDINE GLUCONATE CLOTH 2 % EX PADS
6.0000 | MEDICATED_PAD | Freq: Every day | CUTANEOUS | Status: DC
  Administered 2020-09-07: 6 via TOPICAL

## 2020-09-06 MED ORDER — CEFAZOLIN SODIUM-DEXTROSE 2-4 GM/100ML-% IV SOLN
INTRAVENOUS | Status: AC
  Filled 2020-09-06: qty 100

## 2020-09-06 MED ORDER — ACETAMINOPHEN 650 MG RE SUPP: 650.0000 mg | Freq: Four times a day (QID) | RECTAL | Status: DC | PRN

## 2020-09-06 MED ORDER — ACETAMINOPHEN 325 MG PO TABS: 325.0000 mg | ORAL_TABLET | ORAL | Status: DC | PRN

## 2020-09-06 MED ORDER — LIDOCAINE HCL 1 % IJ SOLN
INTRAMUSCULAR | Status: AC
  Filled 2020-09-06: qty 60

## 2020-09-06 MED ORDER — CEFAZOLIN SODIUM-DEXTROSE 2-4 GM/100ML-% IV SOLN
2.0000 g | INTRAVENOUS | Status: AC
  Administered 2020-09-06: 2 g via INTRAVENOUS
  Filled 2020-09-06: qty 100

## 2020-09-06 MED ORDER — CEFAZOLIN SODIUM-DEXTROSE 1-4 GM/50ML-% IV SOLN
1.0000 g | Freq: Once | INTRAVENOUS | Status: AC
  Administered 2020-09-07: 1 g via INTRAVENOUS
  Filled 2020-09-06: qty 50

## 2020-09-06 MED ORDER — HEPARIN (PORCINE) IN NACL 1000-0.9 UT/500ML-% IV SOLN
INTRAVENOUS | Status: AC
  Filled 2020-09-06: qty 500

## 2020-09-06 MED ORDER — INFLUENZA VAC A&B SA ADJ QUAD 0.5 ML IM PRSY
0.5000 mL | PREFILLED_SYRINGE | INTRAMUSCULAR | Status: AC
  Administered 2020-09-07: 0.5 mL via INTRAMUSCULAR
  Filled 2020-09-06: qty 0.5

## 2020-09-06 MED ORDER — HEPARIN (PORCINE) IN NACL 1000-0.9 UT/500ML-% IV SOLN
INTRAVENOUS | Status: DC | PRN
  Administered 2020-09-06: 500 mL

## 2020-09-06 MED ORDER — CHLORHEXIDINE GLUCONATE 4 % EX LIQD
CUTANEOUS | Status: AC
  Administered 2020-09-06: 4 via TOPICAL
  Filled 2020-09-06: qty 15

## 2020-09-06 MED ORDER — SODIUM CHLORIDE 0.9 % IV SOLN
80.0000 mg | INTRAVENOUS | Status: AC
  Administered 2020-09-06: 80 mg
  Filled 2020-09-06: qty 2

## 2020-09-06 MED ORDER — ACETAMINOPHEN 325 MG PO TABS: 650.0000 mg | ORAL_TABLET | Freq: Four times a day (QID) | ORAL | Status: DC | PRN

## 2020-09-06 MED ORDER — MUPIROCIN 2 % EX OINT
1.0000 "application " | TOPICAL_OINTMENT | Freq: Two times a day (BID) | CUTANEOUS | Status: DC
  Administered 2020-09-06 – 2020-09-09 (×6): 1 via NASAL
  Filled 2020-09-06 (×2): qty 22

## 2020-09-06 MED ORDER — ONDANSETRON HCL 4 MG/2ML IJ SOLN: 4.0000 mg | Freq: Four times a day (QID) | INTRAMUSCULAR | Status: DC | PRN

## 2020-09-06 MED ORDER — HYDRALAZINE HCL 25 MG PO TABS
25.0000 mg | ORAL_TABLET | Freq: Three times a day (TID) | ORAL | Status: DC
  Administered 2020-09-06 – 2020-09-09 (×10): 25 mg via ORAL
  Filled 2020-09-06 (×11): qty 1

## 2020-09-06 SURGICAL SUPPLY — 6 items
CABLE SURGICAL S-101-97-12 (CABLE) ×2 IMPLANT
LEAD TENDRIL MRI 52CM LPA1200M (Lead) ×1 IMPLANT
PACEMAKER ASSURITY SR-SF (Pacemaker) ×1 IMPLANT
PAD PRO RADIOLUCENT 2001M-C (PAD) ×2 IMPLANT
SHEATH 8FR PRELUDE SNAP 13 (SHEATH) ×1 IMPLANT
TRAY PACEMAKER INSERTION (PACKS) ×2 IMPLANT

## 2020-09-06 NOTE — Progress Notes (Signed)
Electrophysiology Rounding Note  Patient Name: Angelica Lane Date of Encounter: 09/06/2020  Primary Cardiologist: No primary care provider on file. Electrophysiologist: New   Subjective   stble this am more alert and oriented  Palliative care consult appreciated for goals of care  Inpatient Medications    Scheduled Meds: . influenza vaccine adjuvanted  0.5 mL Intramuscular Tomorrow-1000  . insulin aspart  0-5 Units Subcutaneous QHS  . insulin aspart  0-9 Units Subcutaneous TID WC  . sodium chloride flush  3 mL Intravenous Q12H   Continuous Infusions: . sodium chloride 50 mL/hr at 09/06/20 0127   PRN Meds: acetaminophen **OR** acetaminophen, ondansetron **OR** ondansetron (ZOFRAN) IV   Vital Signs    Vitals:   09/05/20 1932 09/06/20 0036 09/06/20 0501 09/06/20 0522  BP: (!) 170/62 (!) 153/92 (!) 196/60 (!) 189/62  Pulse: 63 70 66 (!) 55  Resp:  18 18   Temp: 98.4 F (36.9 C) 98.7 F (37.1 C) 98.5 F (36.9 C)   TempSrc: Oral Oral Oral   SpO2: 99% 100% 100%   Weight:   49.9 kg   Height:        Intake/Output Summary (Last 24 hours) at 09/06/2020 0812 Last data filed at 09/06/2020 0500 Gross per 24 hour  Intake 1271.29 ml  Output 1100 ml  Net 171.29 ml   Filed Weights   09/04/20 1551 09/06/20 0501  Weight: 49.4 kg 49.9 kg    Physical Exam    BP (!) 189/62 (BP Location: Right Arm)   Pulse (!) 55   Temp 98.5 F (36.9 C) (Oral)   Resp 18   Ht 4\' 11"  (1.499 m)   Wt 49.9 kg   SpO2 100%   BMI 22.22 kg/m  Well developed and nourished in no acute distress HENT normal Neck supple with JVP  Clear Regular rate and rhythm, no murmurs or gallops Abd-soft with active BS No Clubbing cyanosis edema Skin-warm and dry A & Oriented  Grossly normal sensory and motor function      Labs    CBC Recent Labs    09/04/20 1621 09/04/20 1621 09/04/20 1629 09/05/20 0500  WBC 4.3  --   --  6.0  NEUTROABS 2.6  --   --   --   HGB 11.5*   < > 11.2* 11.0*  HCT  36.2   < > 33.0* 34.7*  MCV 97.3  --   --  99.7  PLT 276  --   --  226   < > = values in this interval not displayed.   Basic Metabolic Panel Recent Labs    09/07/20 1621 09/04/20 1629 09/05/20 1309 09/05/20 2137  NA 139   < > 143 140  K 5.9*   < > 2.8* 4.5  CL 107   < > 120* 109  CO2 22   < > 14* 21*  GLUCOSE 177*   < > 100* 163*  BUN 31*   < > 13 16  CREATININE 1.38*   < > 0.63 0.96  CALCIUM 10.0   < > 6.9* 9.7  MG 1.8  --   --   --    < > = values in this interval not displayed.   Liver Function Tests Recent Labs    09/04/20 1621 09/05/20 0500  AST 26 25  ALT 22 19  ALKPHOS 82 63  BILITOT 0.5 0.4  PROT 6.8 5.6*  ALBUMIN 3.8 3.1*   No results for input(s): LIPASE, AMYLASE in the  last 72 hours. Cardiac Enzymes No results for input(s): CKTOTAL, CKMB, CKMBINDEX, TROPONINI in the last 72 hours.   Telemetry    Recurrent pauses > 7 sec preceded by sinus slowing   Radiology    DG Chest Portable 1 View  Result Date: 09/04/2020 CLINICAL DATA:  Syncope EXAM: PORTABLE CHEST 1 VIEW COMPARISON:  03/23/2018 FINDINGS: Single frontal view of the chest demonstrates a stable cardiac silhouette. Stable atherosclerosis of the aortic arch. No airspace disease, effusion, or pneumothorax. No acute bony abnormalities. IMPRESSION: 1. No acute intrathoracic process. Electronically Signed   By: Sharlet Salina M.D.   On: 09/04/2020 16:39   ECHOCARDIOGRAM COMPLETE  Result Date: 09/05/2020    ECHOCARDIOGRAM REPORT   Patient Name:   Angelica Lane Date of Exam: 09/05/2020 Medical Rec #:  [id number]     Height:       59.0 in Accession #:    [id number]    Weight:       109.0 lb Date of Birth:  [birth year]      BSA:          1.425 m Patient Age:    86 years      BP:           156/57 mmHg Patient Gender: F             HR:           58 bpm. Exam Location:  Inpatient Procedure: 2D Echo, Cardiac Doppler and Color Doppler Indications:    Syncope 780.2 / R55  History:        Patient has no prior history of  Echocardiogram examinations.                 Risk Factors:Hypertension, Diabetes, Dyslipidemia and                 Non-Smoker.  Sonographer:    Renella Cunas RDCS Referring Phys: [id number] MOHAMMAD L GARBA IMPRESSIONS  1. Left ventricular ejection fraction, by estimation, is 60 to 65%. The left ventricle has normal function. The left ventricle has no regional wall motion abnormalities. Left ventricular diastolic parameters are consistent with Grade I diastolic dysfunction (impaired relaxation).  2. Right ventricular systolic function is normal. The right ventricular size is normal.  3. The mitral valve is grossly normal. No evidence of mitral valve regurgitation. No evidence of mitral stenosis.  4. The aortic valve is calcified. Aortic valve regurgitation is not visualized. No aortic stenosis is present. FINDINGS  Left Ventricle: Left ventricular ejection fraction, by estimation, is 60 to 65%. The left ventricle has normal function. The left ventricle has no regional wall motion abnormalities. The left ventricular internal cavity size was normal in size. There is  no left ventricular hypertrophy. Left ventricular diastolic parameters are consistent with Grade I diastolic dysfunction (impaired relaxation). Right Ventricle: The right ventricular size is normal. No increase in right ventricular wall thickness. Right ventricular systolic function is normal. Left Atrium: Left atrial size was normal in size. Right Atrium: Right atrial size was normal in size. Pericardium: There is no evidence of pericardial effusion. Mitral Valve: The mitral valve is grossly normal. No evidence of mitral valve regurgitation. No evidence of mitral valve stenosis. Tricuspid Valve: The tricuspid valve is grossly normal. Tricuspid valve regurgitation is not demonstrated. Aortic Valve: The aortic valve is calcified. Aortic valve regurgitation is not visualized. No aortic stenosis is present. Pulmonic Valve: The pulmonic valve was normal in structure.  Pulmonic valve regurgitation is not visualized.  Aorta: The aortic root and ascending aorta are structurally normal, with no evidence of dilitation. IAS/Shunts: The atrial septum is grossly normal.  LEFT VENTRICLE PLAX 2D LVIDd:         4.40 cm     Diastology LVIDs:         3.20 cm     LV e' medial:    5.22 cm/s LV PW:         0.70 cm     LV E/e' medial:  14.9 LV IVS:        0.70 cm     LV e' lateral:   5.77 cm/s LVOT diam:     1.90 cm     LV E/e' lateral: 13.4 LV SV:         74 LV SV Index:   52 LVOT Area:     2.84 cm  LV Volumes (MOD) LV vol d, MOD A2C: 69.8 ml LV vol d, MOD A4C: 67.9 ml LV vol s, MOD A2C: 23.2 ml LV vol s, MOD A4C: 29.0 ml LV SV MOD A2C:     46.6 ml LV SV MOD A4C:     67.9 ml LV SV MOD BP:      42.6 ml RIGHT VENTRICLE RV S prime:     9.90 cm/s TAPSE (M-mode): 1.8 cm LEFT ATRIUM             Index       RIGHT ATRIUM          Index LA diam:        3.70 cm 2.60 cm/m  RA Area:     7.77 cm LA Vol (A2C):   22.1 ml 15.51 ml/m RA Volume:   12.10 ml 8.49 ml/m LA Vol (A4C):   24.3 ml 17.05 ml/m LA Biplane Vol: 25.0 ml 17.54 ml/m  AORTIC VALVE LVOT Vmax:   113.00 cm/s LVOT Vmean:  79.800 cm/s LVOT VTI:    0.262 m  AORTA Ao Root diam: 2.70 cm MITRAL VALVE MV Area (PHT): 2.62 cm     SHUNTS MV Decel Time: 289 msec     Systemic VTI:  0.26 m MV E velocity: 77.60 cm/s   Systemic Diam: 1.90 cm MV A velocity: 110.00 cm/s MV E/A ratio:  0.71 Kristeen MissPhilip Nahser MD Electronically signed by Kristeen MissPhilip Nahser MD Signature Date/Time: 09/05/2020/12:29:07 PM    Final     Patient Profile     Angelica Lane is a 86 y.o. female with a past medical history significant for moderate dementia and falls.  she was admitted for syncope.   Assessment & Plan    Sinus node dysfunction with slowing and prolonged pauses  Syncope and falls  Hypertension-poorly controlled  Dementia   Sinus slowing persists now 48 hours following washout of carvedilol.  Namenda and Aricept were also held.  We are left with significant concerns  of recurrent syncope and falls associated with sinus pauses.  The mechanism is a little bit curious in that it is preceded by PP prolongation suggesting hyper vagotonic effect although there is no clear triggers.  She is much clearer today.  The concern about not pacing is that she is at a significant risk for fall and fracture.  Concerns regarding the pacing include her dementia.  Given her current mental status, I will reach out to the family to discuss pacing and have reached out to colleagues about the possibility of getting it done today if all are in agreement.  Pacing options could  include leadless or single-chamber transvenous devices. She would need a sitter following the procedure to minimize the likelihood of postprocedural complications.  We will add hydralazine this morning for her blood pressure as it has some reflex sympathetic activity     Sherryl Manges, MD  09/06/2020, 8:12 AM

## 2020-09-06 NOTE — Progress Notes (Signed)
PROGRESS NOTE    Angelica Lane  ZOX:[id number] DOB: [birth year] DOA: 09/04/2020 PCP: [place], MD   Chief Complaint  Patient presents with  . Bradycardia   Brief Narrative: Per HPI: 86 y.o. female with medical history significant of hypertension, hyperlipidemia, diabetes, dementia, generalized debility and depression admitted secondary to syncopal episode versus uncontrolled, felt unsteady and dizzy.  History of fall in the past thought to be secondary to orthostasis.  She is on carvedilol and clonidine for hypertension. She had fall about 2 weeks ago with facial trauma and was seen at [facility].  ED Course: Temperature is 97.8 blood pressure 116/53 pulse 48 respirate 21 oxygen sat 98% room air.  Potassium is 6.4 hemoglobin 11.2 otherwise chemistry also within normal.  She has a BUN of 29 creatinine 1.1 calcium 10.0 glucose 153.  Hemoglobin A1c of 6.8 troponin of 8.  TSH 1.112.  Chest x-ray showed no acute findings.  Patient being admitted with symptomatic bradycardia. Seen by EP-her carvedilol Namenda and Aricept has been discontinued and allowing for washout for beta-blocker. Being monitored on telemetry  Subjective:  She is alert awake oriented to place people but not to current president or current date-with her baseline dementia but pleasant.  She is interested to read literature on pacemaker. Blood pressure on higher side.  Assessment & Plan:  Symptomatic bradycardia with syncope/sinus arrest: history of recurrent fall, found to have sinus node dysfunction/sinus arrest in the setting of carvedilol clonidine Namenda and Aricept.  Sinus slowing still persist 48 hours after washout of the carvedilol.  EP cardiology on board awaiting for further plan for pacemaker or not.  Dementia without behavioral disturbance: She is alert awake fairly oriented.  Continue supportive care. Aricept discontinued.  Hyperlipidemia continue statin/Zetia  Diabetes mellitus, controlled hbA1c 6.8.   Blood sugar is stable on sliding scale.  Recent Labs  Lab 09/05/20 0538 09/05/20 0802 09/05/20 1144 09/05/20 2057 09/06/20 0602  GLUCAP 172* 157* 123* 182* 118*   Lab Results  Component Value Date   HGBA1C 6.8 (H) 09/04/2020   Essential hypertension: BP poorly controlled, hydralazine added given the reflex sympathetic stimulation.   Hyperkalemia-resolved.  HypoKalemia replaced and now stable.   Generalized debility: PT OT eval once stable from EP standpoint.  GOC: Palliative care input appreciated.  Patient is currently DNR with full scope of treatment.  Appreciate input.   DVT prophylaxis: SCDs Start: 09/04/20 1952 Code Status:   Code Status: DNR  Family Communication: plan of care discussed with patient at bedside.  I discussed with patient's son and daughter in law over the phone 9/23  Status is: Inpatient Remains inpatient appropriate because:Inpatient level of care appropriate due to severity of illness and Due to symptomatic bradycardia and syncope   Dispo: The patient is from: Home lives w/ family              Anticipated d/c is to: TBD              Anticipated d/c date is: 2 days              Patient currently is not medically stable to d/c.  Nutrition: Diet Order            Diet NPO time specified Except for: Sips with Meds  Diet effective now                  Body mass index is 22.22 kg/m.  Consultants:see note  Procedures:see note Microbiology:see note Blood Culture  Component Value Date/Time   SDES URINE, RANDOM 10/25/2019 1602   SPECREQUEST  10/25/2019 1602    NONE Performed at [facility], [address] [place], [place] [address]    CULT MULTIPLE SPECIES PRESENT, SUGGEST RECOLLECTION (A) 10/25/2019 1602   REPTSTATUS 10/26/2019 FINAL 10/25/2019 1602    Other culture-see note  Medications: Scheduled Meds: . hydrALAZINE  25 mg Oral Q8H  . influenza vaccine adjuvanted  0.5 mL Intramuscular Tomorrow-1000  . insulin aspart  0-5  Units Subcutaneous QHS  . insulin aspart  0-9 Units Subcutaneous TID WC  . sodium chloride flush  3 mL Intravenous Q12H   Continuous Infusions: . sodium chloride 50 mL/hr at 09/06/20 0127    Antimicrobials: Anti-infectives (From admission, onward)   None     Objective: Vitals: Today's Vitals   09/05/20 2206 09/06/20 0036 09/06/20 0501 09/06/20 0522  BP:  (!) 153/92 (!) 196/60 (!) 189/62  Pulse:  70 66 (!) 55  Resp:  18 18   Temp:  98.7 F (37.1 C) 98.5 F (36.9 C)   TempSrc:  Oral Oral   SpO2:  100% 100%   Weight:   49.9 kg   Height:      PainSc: Asleep       Intake/Output Summary (Last 24 hours) at 09/06/2020 0918 Last data filed at 09/06/2020 0500 Gross per 24 hour  Intake 1271.29 ml  Output 1100 ml  Net 171.29 ml   Filed Weights   09/04/20 1551 09/06/20 0501  Weight: 49.4 kg 49.9 kg   Weight change: 0.454 kg  Intake/Output from previous day: 09/23 0701 - 09/24 0700 In: 1833.8 [P.O.:240; I.V.:1232.4; IV Piggyback:361.4] Out: 1100 [Urine:1100] Intake/Output this shift: No intake/output data recorded.  Examination:  General exam: AAOx2-3, with baseline dementia, NAD, weak appearing. HEENT:Oral mucosa moist, Ear/Nose WNL grossly, dentition normal. Respiratory system: bilaterally clear,no wheezing or crackles,no use of accessory muscle Cardiovascular system: S1 & S2 +, No JVD,. Gastrointestinal system: Abdomen soft, NT,ND, BS+ Nervous System:Alert, awake, moving extremities and grossly nonfocal Extremities: No edema, distal peripheral pulses palpable.  Skin: No rashes,no icterus.  chronic hyperpigmented skin changes MSK: Normal muscle bulk,tone, power  Data Reviewed: I have personally reviewed following labs and imaging studies CBC: Recent Labs  Lab 09/04/20 1621 09/04/20 1629 09/05/20 0500  WBC 4.3  --  6.0  NEUTROABS 2.6  --   --   HGB 11.5* 11.2* 11.0*  HCT 36.2 33.0* 34.7*  MCV 97.3  --  99.7  PLT 276  --  226   Basic Metabolic Panel: Recent  Labs  Lab 09/04/20 1621 09/04/20 1621 09/04/20 1629 09/04/20 1936 09/05/20 0500 09/05/20 1309 09/05/20 2137  NA 139  --  141  --  140 143 140  K 5.9*   < > 5.0 6.4* 3.2* 2.8* 4.5  CL 107  --  110  --  111 120* 109  CO2 22  --   --   --  16* 14* 21*  GLUCOSE 177*  --  153*  --  155* 100* 163*  BUN 31*  --  29*  --  21 13 16   CREATININE 1.38*  --  1.10*  --  0.98 0.63 0.96  CALCIUM 10.0  --   --   --  8.0* 6.9* 9.7  MG 1.8  --   --   --   --   --   --    < > = values in this interval not displayed.   GFR:  Estimated Creatinine Clearance: 26.6 mL/min (by C-G formula based on SCr of 0.96 mg/dL). Liver Function Tests: Recent Labs  Lab 09/04/20 1621 09/05/20 0500  AST 26 25  ALT 22 19  ALKPHOS 82 63  BILITOT 0.5 0.4  PROT 6.8 5.6*  ALBUMIN 3.8 3.1*   No results for input(s): LIPASE, AMYLASE in the last 168 hours. No results for input(s): AMMONIA in the last 168 hours. Coagulation Profile: No results for input(s): INR, PROTIME in the last 168 hours. Cardiac Enzymes: No results for input(s): CKTOTAL, CKMB, CKMBINDEX, TROPONINI in the last 168 hours. BNP (last 3 results) No results for input(s): PROBNP in the last 8760 hours. HbA1C: Recent Labs    09/04/20 1952  HGBA1C 6.8*   CBG: Recent Labs  Lab 09/05/20 0538 09/05/20 0802 09/05/20 1144 09/05/20 2057 09/06/20 0602  GLUCAP 172* 157* 123* 182* 118*   Lipid Profile: No results for input(s): CHOL, HDL, LDLCALC, TRIG, CHOLHDL, LDLDIRECT in the last 72 hours. Thyroid Function Tests: Recent Labs    09/04/20 1621  TSH 1.112   Anemia Panel: No results for input(s): VITAMINB12, FOLATE, FERRITIN, TIBC, IRON, RETICCTPCT in the last 72 hours. Sepsis Labs: No results for input(s): PROCALCITON, LATICACIDVEN in the last 168 hours.  Recent Results (from the past 240 hour(s))  SARS Coronavirus 2 by RT PCR (hospital order, performed in [facility] hospital lab) Nasopharyngeal Nasopharyngeal Swab     Status: None    Collection Time: 09/04/20  6:29 PM   Specimen: Nasopharyngeal Swab  Result Value Ref Range Status   SARS Coronavirus 2 NEGATIVE NEGATIVE Final    Comment: (NOTE) SARS-CoV-2 target nucleic acids are NOT DETECTED.  The SARS-CoV-2 RNA is generally detectable in upper and lower respiratory specimens during the acute phase of infection. The lowest concentration of SARS-CoV-2 viral copies this assay can detect is 250 copies / mL. A negative result does not preclude SARS-CoV-2 infection and should not be used as the sole basis for treatment or other patient management decisions.  A negative result may occur with improper specimen collection / handling, submission of specimen other than nasopharyngeal swab, presence of viral mutation(s) within the areas targeted by this assay, and inadequate number of viral copies (<250 copies / mL). A negative result must be combined with clinical observations, patient history, and epidemiological information.  Fact Sheet for Patients:   BoilerBrush.com.cy  Fact Sheet for Healthcare Providers: https://pope.com/  This test is not yet approved or  cleared by the [place] FDA and has been authorized for detection and/or diagnosis of SARS-CoV-2 by FDA under an Emergency Use Authorization (EUA).  This EUA will remain in effect (meaning this test can be used) for the duration of the COVID-19 declaration under Section 564(b)(1) of the Act, 21 U.S.C. section 360bbb-3(b)(1), unless the authorization is terminated or revoked sooner.  Performed at [facility], [address] [place], [place] [address]      Radiology Studies: DG Chest Portable 1 View  Result Date: 09/04/2020 CLINICAL DATA:  Syncope EXAM: PORTABLE CHEST 1 VIEW COMPARISON:  03/23/2018 FINDINGS: Single frontal view of the chest demonstrates a stable cardiac silhouette. Stable atherosclerosis of the aortic arch. No airspace disease,  effusion, or pneumothorax. No acute bony abnormalities. IMPRESSION: 1. No acute intrathoracic process. Electronically Signed   By: Sharlet Salina M.D.   On: 09/04/2020 16:39   ECHOCARDIOGRAM COMPLETE  Result Date: 09/05/2020    ECHOCARDIOGRAM REPORT   Patient Name:   Angelica Lane Date of Exam: 09/05/2020  Medical Rec #:  [id number]     Height:       59.0 in Accession #:    [id number]    Weight:       109.0 lb Date of Birth:  Apr 14, 1928      BSA:          1.425 m Patient Age:    89 years      BP:           156/57 mmHg Patient Gender: F             HR:           58 bpm. Exam Location:  Inpatient Procedure: 2D Echo, Cardiac Doppler and Color Doppler Indications:    Syncope 780.2 / R55  History:        Patient has no prior history of Echocardiogram examinations.                 Risk Factors:Hypertension, Diabetes, Dyslipidemia and                 Non-Smoker.  Sonographer:    Renella Cunas RDCS Referring Phys: [id number] MOHAMMAD L GARBA IMPRESSIONS  1. Left ventricular ejection fraction, by estimation, is 60 to 65%. The left ventricle has normal function. The left ventricle has no regional wall motion abnormalities. Left ventricular diastolic parameters are consistent with Grade I diastolic dysfunction (impaired relaxation).  2. Right ventricular systolic function is normal. The right ventricular size is normal.  3. The mitral valve is grossly normal. No evidence of mitral valve regurgitation. No evidence of mitral stenosis.  4. The aortic valve is calcified. Aortic valve regurgitation is not visualized. No aortic stenosis is present. FINDINGS  Left Ventricle: Left ventricular ejection fraction, by estimation, is 60 to 65%. The left ventricle has normal function. The left ventricle has no regional wall motion abnormalities. The left ventricular internal cavity size was normal in size. There is  no left ventricular hypertrophy. Left ventricular diastolic parameters are consistent with Grade I diastolic dysfunction (impaired  relaxation). Right Ventricle: The right ventricular size is normal. No increase in right ventricular wall thickness. Right ventricular systolic function is normal. Left Atrium: Left atrial size was normal in size. Right Atrium: Right atrial size was normal in size. Pericardium: There is no evidence of pericardial effusion. Mitral Valve: The mitral valve is grossly normal. No evidence of mitral valve regurgitation. No evidence of mitral valve stenosis. Tricuspid Valve: The tricuspid valve is grossly normal. Tricuspid valve regurgitation is not demonstrated. Aortic Valve: The aortic valve is calcified. Aortic valve regurgitation is not visualized. No aortic stenosis is present. Pulmonic Valve: The pulmonic valve was normal in structure. Pulmonic valve regurgitation is not visualized. Aorta: The aortic root and ascending aorta are structurally normal, with no evidence of dilitation. IAS/Shunts: The atrial septum is grossly normal.  LEFT VENTRICLE PLAX 2D LVIDd:         4.40 cm     Diastology LVIDs:         3.20 cm     LV e' medial:    5.22 cm/s LV PW:         0.70 cm     LV E/e' medial:  14.9 LV IVS:        0.70 cm     LV e' lateral:   5.77 cm/s LVOT diam:     1.90 cm     LV E/e' lateral: 13.4 LV SV:  74 LV SV Index:   52 LVOT Area:     2.84 cm  LV Volumes (MOD) LV vol d, MOD A2C: 69.8 ml LV vol d, MOD A4C: 67.9 ml LV vol s, MOD A2C: 23.2 ml LV vol s, MOD A4C: 29.0 ml LV SV MOD A2C:     46.6 ml LV SV MOD A4C:     67.9 ml LV SV MOD BP:      42.6 ml RIGHT VENTRICLE RV S prime:     9.90 cm/s TAPSE (M-mode): 1.8 cm LEFT ATRIUM             Index       RIGHT ATRIUM          Index LA diam:        3.70 cm 2.60 cm/m  RA Area:     7.77 cm LA Vol (A2C):   22.1 ml 15.51 ml/m RA Volume:   12.10 ml 8.49 ml/m LA Vol (A4C):   24.3 ml 17.05 ml/m LA Biplane Vol: 25.0 ml 17.54 ml/m  AORTIC VALVE LVOT Vmax:   113.00 cm/s LVOT Vmean:  79.800 cm/s LVOT VTI:    0.262 m  AORTA Ao Root diam: 2.70 cm MITRAL VALVE MV Area (PHT):  2.62 cm     SHUNTS MV Decel Time: 289 msec     Systemic VTI:  0.26 m MV E velocity: 77.60 cm/s   Systemic Diam: 1.90 cm MV A velocity: 110.00 cm/s MV E/A ratio:  0.71 Kristeen Miss MD Electronically signed by Kristeen Miss MD Signature Date/Time: 09/05/2020/12:29:07 PM    Final      LOS: 2 days   Lanae Boast, MD [facility]  09/06/2020, 9:18 AM

## 2020-09-06 NOTE — H&P (Signed)
Electrophysiology Rounding Note  Patient Name: Angelica Lane Date of Encounter: 09/06/2020  Primary Cardiologist: No primary care provider on file. Electrophysiologist: New   Subjective   stble this am more alert and oriented  Palliative care consult appreciated for goals of care  Inpatient Medications    Scheduled Meds: . influenza vaccine adjuvanted  0.5 mL Intramuscular Tomorrow-1000  . insulin aspart  0-5 Units Subcutaneous QHS  . insulin aspart  0-9 Units Subcutaneous TID WC  . sodium chloride flush  3 mL Intravenous Q12H   Continuous Infusions: . sodium chloride 50 mL/hr at 09/06/20 0127   PRN Meds: acetaminophen **OR** acetaminophen, ondansetron **OR** ondansetron (ZOFRAN) IV   Vital Signs          Vitals:   09/05/20 1932 09/06/20 0036 09/06/20 0501 09/06/20 0522  BP: (!) 170/62 (!) 153/92 (!) 196/60 (!) 189/62  Pulse: 63 70 66 (!) 55  Resp:  18 18   Temp: 98.4 F (36.9 C) 98.7 F (37.1 C) 98.5 F (36.9 C)   TempSrc: Oral Oral Oral   SpO2: 99% 100% 100%   Weight:   49.9 kg   Height:        Intake/Output Summary (Last 24 hours) at 09/06/2020 0812 Last data filed at 09/06/2020 0500    Gross per 24 hour  Intake 1271.29 ml  Output 1100 ml  Net 171.29 ml       Filed Weights   09/04/20 1551 09/06/20 0501  Weight: 49.4 kg 49.9 kg    Physical Exam    BP (!) 189/62 (BP Location: Right Arm)   Pulse (!) 55   Temp 98.5 F (36.9 C) (Oral)   Resp 18   Ht 4\' 11"  (1.499 m)   Wt 49.9 kg   SpO2 100%   BMI 22.22 kg/m  Well developed and nourished in no acute distress HENT normal Neck supple with JVP  Clear Regular rate and rhythm, no murmurs or gallops Abd-soft with active BS No Clubbing cyanosis edema Skin-warm and dry A & Oriented  Grossly normal sensory and motor function      Labs    CBC Recent Labs (last 2 labs)         Recent Labs    09/04/20 1621 09/04/20 1621 09/04/20 1629 09/05/20 0500  WBC  4.3  --   --  6.0  NEUTROABS 2.6  --   --   --   HGB 11.5*   < > 11.2* 11.0*  HCT 36.2   < > 33.0* 34.7*  MCV 97.3  --   --  99.7  PLT 276  --   --  226   < > = values in this interval not displayed.     Basic Metabolic Panel Recent Labs (last 2 labs)         Recent Labs    09/04/20 1621 09/04/20 1629 09/05/20 1309 09/05/20 2137  NA 139   < > 143 140  K 5.9*   < > 2.8* 4.5  CL 107   < > 120* 109  CO2 22   < > 14* 21*  GLUCOSE 177*   < > 100* 163*  BUN 31*   < > 13 16  CREATININE 1.38*   < > 0.63 0.96  CALCIUM 10.0   < > 6.9* 9.7  MG 1.8  --   --   --    < > = values in this interval not displayed.     Liver Function Tests Recent  Labs (last 2 labs)       Recent Labs    09/04/20 1621 09/05/20 0500  AST 26 25  ALT 22 19  ALKPHOS 82 63  BILITOT 0.5 0.4  PROT 6.8 5.6*  ALBUMIN 3.8 3.1*     Recent Labs (last 2 labs)   No results for input(s): LIPASE, AMYLASE in the last 72 hours.   Cardiac Enzymes Recent Labs (last 2 labs)   No results for input(s): CKTOTAL, CKMB, CKMBINDEX, TROPONINI in the last 72 hours.     Telemetry    Recurrent pauses > 7 sec preceded by sinus slowing   Radiology     Imaging Results (Last 48 hours)  DG Chest Portable 1 View  Result Date: 09/04/2020 CLINICAL DATA:  Syncope EXAM: PORTABLE CHEST 1 VIEW COMPARISON:  03/23/2018 FINDINGS: Single frontal view of the chest demonstrates a stable cardiac silhouette. Stable atherosclerosis of the aortic arch. No airspace disease, effusion, or pneumothorax. No acute bony abnormalities. IMPRESSION: 1. No acute intrathoracic process. Electronically Signed   By: Sharlet Salina M.D.   On: 09/04/2020 16:39   ECHOCARDIOGRAM COMPLETE  Result Date: 09/05/2020    ECHOCARDIOGRAM REPORT   Patient Name:   Angelica Lane Date of Exam: 09/05/2020 Medical Rec #:  [id number]     Height:       59.0 in Accession #:    [id number]    Weight:       109.0 lb Date of Birth:  04-21-1928      BSA:          1.425 m  Patient Age:    89 years      BP:           156/57 mmHg Patient Gender: F             HR:           58 bpm. Exam Location:  Inpatient Procedure: 2D Echo, Cardiac Doppler and Color Doppler Indications:    Syncope 780.2 / R55  History:        Patient has no prior history of Echocardiogram examinations.                 Risk Factors:Hypertension, Diabetes, Dyslipidemia and                 Non-Smoker.  Sonographer:    Renella Cunas RDCS Referring Phys: [id number] MOHAMMAD L GARBA IMPRESSIONS  1. Left ventricular ejection fraction, by estimation, is 60 to 65%. The left ventricle has normal function. The left ventricle has no regional wall motion abnormalities. Left ventricular diastolic parameters are consistent with Grade I diastolic dysfunction (impaired relaxation).  2. Right ventricular systolic function is normal. The right ventricular size is normal.  3. The mitral valve is grossly normal. No evidence of mitral valve regurgitation. No evidence of mitral stenosis.  4. The aortic valve is calcified. Aortic valve regurgitation is not visualized. No aortic stenosis is present. FINDINGS  Left Ventricle: Left ventricular ejection fraction, by estimation, is 60 to 65%. The left ventricle has normal function. The left ventricle has no regional wall motion abnormalities. The left ventricular internal cavity size was normal in size. There is  no left ventricular hypertrophy. Left ventricular diastolic parameters are consistent with Grade I diastolic dysfunction (impaired relaxation). Right Ventricle: The right ventricular size is normal. No increase in right ventricular wall thickness. Right ventricular systolic function is normal. Left Atrium: Left atrial size was normal in size. Right Atrium: Right atrial size  was normal in size. Pericardium: There is no evidence of pericardial effusion. Mitral Valve: The mitral valve is grossly normal. No evidence of mitral valve regurgitation. No evidence of mitral valve stenosis. Tricuspid  Valve: The tricuspid valve is grossly normal. Tricuspid valve regurgitation is not demonstrated. Aortic Valve: The aortic valve is calcified. Aortic valve regurgitation is not visualized. No aortic stenosis is present. Pulmonic Valve: The pulmonic valve was normal in structure. Pulmonic valve regurgitation is not visualized. Aorta: The aortic root and ascending aorta are structurally normal, with no evidence of dilitation. IAS/Shunts: The atrial septum is grossly normal.  LEFT VENTRICLE PLAX 2D LVIDd:         4.40 cm     Diastology LVIDs:         3.20 cm     LV e' medial:    5.22 cm/s LV PW:         0.70 cm     LV E/e' medial:  14.9 LV IVS:        0.70 cm     LV e' lateral:   5.77 cm/s LVOT diam:     1.90 cm     LV E/e' lateral: 13.4 LV SV:         74 LV SV Index:   52 LVOT Area:     2.84 cm  LV Volumes (MOD) LV vol d, MOD A2C: 69.8 ml LV vol d, MOD A4C: 67.9 ml LV vol s, MOD A2C: 23.2 ml LV vol s, MOD A4C: 29.0 ml LV SV MOD A2C:     46.6 ml LV SV MOD A4C:     67.9 ml LV SV MOD BP:      42.6 ml RIGHT VENTRICLE RV S prime:     9.90 cm/s TAPSE (M-mode): 1.8 cm LEFT ATRIUM             Index       RIGHT ATRIUM          Index LA diam:        3.70 cm 2.60 cm/m  RA Area:     7.77 cm LA Vol (A2C):   22.1 ml 15.51 ml/m RA Volume:   12.10 ml 8.49 ml/m LA Vol (A4C):   24.3 ml 17.05 ml/m LA Biplane Vol: 25.0 ml 17.54 ml/m  AORTIC VALVE LVOT Vmax:   113.00 cm/s LVOT Vmean:  79.800 cm/s LVOT VTI:    0.262 m  AORTA Ao Root diam: 2.70 cm MITRAL VALVE MV Area (PHT): 2.62 cm     SHUNTS MV Decel Time: 289 msec     Systemic VTI:  0.26 m MV E velocity: 77.60 cm/s   Systemic Diam: 1.90 cm MV A velocity: 110.00 cm/s MV E/A ratio:  0.71 Kristeen Miss MD Electronically signed by Kristeen Miss MD Signature Date/Time: 09/05/2020/12:29:07 PM    Final      Patient Profile     Lamae Fosco is a 86 y.o. female with a past medical history significant for moderate dementia and falls.  she was admitted for syncope.   Assessment &  Plan    Sinus node dysfunction with slowing and prolonged pauses  Syncope and falls  Hypertension-poorly controlled  Dementia   Sinus slowing persists now 48 hours following washout of carvedilol.  Namenda and Aricept were also held.  We are left with significant concerns of recurrent syncope and falls associated with sinus pauses.  The mechanism is a little bit curious in that it is preceded by PP prolongation  suggesting hyper vagotonic effect although there is no clear triggers.  She is much clearer today.  The concern about not pacing is that she is at a significant risk for fall and fracture.  Concerns regarding the pacing include her dementia.  Given her current mental status, I will reach out to the family to discuss pacing and have reached out to colleagues about the possibility of getting it done today if all are in agreement.  Pacing options could include leadless or single-chamber transvenous devices. She would need a sitter following the procedure to minimize the likelihood of postprocedural complications.  We will add hydralazine this morning for her blood pressure as it has some reflex sympathetic activity     Sherryl MangesSteven Klein, MD  09/06/2020, 8:12 AM          Note Details  Author Duke SalviaKlein, Steven C, MD File Time 09/06/2020 8:21 AM  Author Type Physician Status Signed  Last Editor Duke SalviaKlein, Steven C, MD Service Electrophysiology  Hospital Acct # [id number] Admit Date 09/04/2020   EP Attendint  Patient seen and examined. Agree with above. The patient is referred for PPM insertion due to long pauses. I have discussed with the patient and her daughter in law. Her neuro status is improved today and she can answer simple questions. In light of prolonged pauses of over 10 seconds she will undergo PPM insertion. I have reviewed the indications/risks/benefits/goals/expectations of the procedure and she wishes to proceed.  Sharlot GowdaGregg Wonder Donaway,MD

## 2020-09-06 NOTE — TOC Initial Note (Addendum)
Transition of Care [facility]) - Initial/Assessment Note    Patient Details  Name: Angelica Lane MRN: [id number] Date of Birth: 06/26/1928  Transition of Care [facility]) CM/SW Contact:    Carmina Miller, LCSWA Phone Number: 09/06/2020, 4:01 PM  Clinical Narrative:                 CSW received consult for possible SNF placement at time of discharge. CSW spoke with patient and her daughter in law Bridgette at bedside regarding PT recommendation of SNF placement at time of discharge. Bridgette reported that her and her husband are currently unable to care for patient at their home given patient's current physical needs and fall risk. Patient and her daughter expressed understanding of PT recommendation and is agreeable to SNF placement at time of discharge. Patient reports preference for SNFs in [place] or [place]. CSW discussed insurance authorization process and provided Medicare SNF ratings list. Patient has received the COVID vaccines. Patient's daughter expressed being hopeful for rehab and to feel better soon. No further questions reported at this time. CSW to continue to follow and assist with discharge planning needs.   Expected Discharge Plan: Skilled Nursing Facility Barriers to Discharge: Continued Medical Work up, English as a second language teacher   Patient Goals and CMS Choice Patient states their goals for this hospitalization and ongoing recovery are:: Get better soon CMS Medicare.gov Compare Post Acute Care list provided to:: Patient Represenative (must comment) (Bridgette) Choice offered to / list presented to : Adult Children  Expected Discharge Plan and Services Expected Discharge Plan: Skilled Nursing Facility     Post Acute Care Choice: Skilled Nursing Facility                               [facility] Agency: NA        Prior Living Arrangements/Services     Patient language and need for interpreter reviewed:: Yes Do you feel safe going back to the place where you live?: No    No one to take care of 24 hours a day  Need for Family Participation in Patient Care: Yes (Comment) Care giver support system in place?: No (comment)   Criminal Activity/Legal Involvement Pertinent to Current Situation/Hospitalization: No - Comment as needed  Activities of Daily Living Home Assistive Devices/Equipment: Dentures (specify type), Walker (specify type), Shower chair without back ADL Screening (condition at time of admission) Patient's cognitive ability adequate to safely complete daily activities?: Yes Is the patient deaf or have difficulty hearing?: No Does the patient have difficulty seeing, even when wearing glasses/contacts?: No Does the patient have difficulty concentrating, remembering, or making decisions?: Yes Patient able to express need for assistance with ADLs?: Yes Does the patient have difficulty dressing or bathing?: No Independently performs ADLs?: Yes (appropriate for developmental age) Does the patient have difficulty walking or climbing stairs?: Yes Weakness of Legs: Both Weakness of Arms/Hands: None  Permission Sought/Granted Permission sought to share information with : Case Manager, Magazine features editor, Family Supports       Permission granted to share info w AGENCY: SNFs  Permission granted to share info w Relationship: Daughter in Social worker  Permission granted to share info w Contact Information: [phone]  Emotional Assessment Appearance:: Appears stated age Attitude/Demeanor/Rapport: Gracious Affect (typically observed): Calm Orientation: : Oriented to Self, Oriented to Place, Oriented to  Time, Oriented to Situation Alcohol / Substance Use: Not Applicable Psych Involvement: No (comment)  Admission diagnosis:  Symptomatic bradycardia [R00.1] Patient  Active Problem List   Diagnosis Date Noted  . Palliative care by specialist   . Goals of care, counseling/discussion   . Encounter for hospice care discussion   . DNR (do not  resuscitate)   . DNI (do not intubate)   . Symptomatic bradycardia 09/04/2020  . Dementia without behavioral disturbance (HCC) 09/04/2020  . Diabetes (HCC) 09/04/2020  . Essential hypertension 09/04/2020   PCP:  Harvest Forest, MD Pharmacy:   [facility] [phone], [place] - [address] [place] [address] Phone: [phone] Fax: [phone]     Social Determinants of Health (SDOH) Interventions    Readmission Risk Interventions No flowsheet data found.

## 2020-09-06 NOTE — Progress Notes (Signed)
CCMD called about patient having an 11sec pause. Patient brady'd down to about 23-25 (as she's been doing). Patient asymptomatic. RN and NT were in room. RN fixed telemetry leads. Will continue to monitor. Notified on-call cardiology about possible episode and elevated BP.

## 2020-09-06 NOTE — Progress Notes (Signed)
Daily Progress Note   Patient Name: Angelica Lane       Date: 09/06/2020 DOB: 1928-03-01  Age: 86 y.o. MRN#: [id number] Attending Physician: Lanae Boast, MD Primary Care Physician: Harvest Forest, MD Admit Date: 09/04/2020  Reason for Consultation/Follow-up: Disposition and Establishing goals of care  Subjective: Chart review performed. Received report from primary RN - no acute concerns. RN provided update that family/patient had decided to proceed with pacemaker placement and is scheduled for this afternoon at 3pm.   Went to visit patient at bedside - daughter-in-law/Bridgette was present. Patient was lying in bed, awake, alert, oriented x2. She denied pain. No signs or non-verbal gestures of pain or discomfort noted. No respiratory distress, increased work of breathing, or secretions noted.   Ongoing conversation with Bridgette regarding plan of care. She reported that they had decided to proceed with pacemaker placement today. Family was told today the patient would likely be discharged tomorrow - Bridgette expressed worry they were not able to provide care that would meet the patient's needs at home/patient did not have have needed coordination completed for transfer to MD. Hughie Closs had already spoken with LCSW about concern - they discussed that patient could discharge to a short term rehab while coordination for MD transfer was still being worked on [employer] was Adult nurse and agreeable to this plan. Outpatient Palliative Care services were explained and offered - she was also appreciative and agreeable for the extra support.   Addressed all questions/concerns. Encouraged family to call with questions/concern. PMT card was previously provided.  Length of Stay: 2  Current  Medications: Scheduled Meds:  . chlorhexidine      . gentamicin irrigation  80 mg Irrigation To Cath  . hydrALAZINE  25 mg Oral Q8H  . [START ON 09/07/2020] influenza vaccine adjuvanted  0.5 mL Intramuscular Tomorrow-1000  . insulin aspart  0-5 Units Subcutaneous QHS  . insulin aspart  0-9 Units Subcutaneous TID WC  . sodium chloride flush  3 mL Intravenous Q12H    Continuous Infusions: . sodium chloride 50 mL/hr at 09/06/20 1122  . sodium chloride    . sodium chloride    .  ceFAZolin (ANCEF) IV      PRN Meds: acetaminophen **OR** acetaminophen, ondansetron **OR** ondansetron (ZOFRAN) IV  Physical Exam Vitals and nursing note reviewed.  Constitutional:  General: She is awake. She is not in acute distress. Pulmonary:     Effort: No respiratory distress.  Skin:    General: Skin is warm and dry.  Neurological:     Mental Status: She is alert. Mental status is at baseline. She is disoriented.     Motor: Weakness present.  Psychiatric:        Attention and Perception: Attention normal.        Behavior: Behavior is cooperative.        Cognition and Memory: Cognition is impaired. Memory is impaired.             Vital Signs: BP (!) 173/68 (BP Location: Left Arm)   Pulse 73   Temp 98 F (36.7 C)   Resp 15   Ht 4\' 11"  (1.499 m)   Wt 49.9 kg   SpO2 100%   BMI 22.22 kg/m  SpO2: SpO2: 100 % O2 Device: O2 Device: Room Air O2 Flow Rate:    Intake/output summary:   Intake/Output Summary (Last 24 hours) at 09/06/2020 1456 Last data filed at 09/06/2020 1313 Gross per 24 hour  Intake 1075.42 ml  Output 1750 ml  Net -674.58 ml   LBM: Last BM Date: 09/05/20 Baseline Weight: Weight: 49.4 kg Most recent weight: Weight: 49.9 kg       Palliative Assessment/Data: PPS 30%    Flowsheet Rows     Most Recent Value  Intake Tab  Referral Department Cardiology  Unit at Time of Referral ER  Palliative Care Primary Diagnosis Neurology  Date Notified 09/05/20  Palliative  Care Type New Palliative care  Reason for referral Clarify Goals of Care, Other (Comment)  [failure to thrive]  Date of Admission 09/05/20  Date first seen by Palliative Care 09/05/20  # of days Palliative referral response time 0 Day(s)  # of days IP prior to Palliative referral 0  Clinical Assessment  Psychosocial & Spiritual Assessment  Palliative Care Outcomes  Patient/Family meeting held? Yes  Who was at the meeting? son, daughter-in-law  Palliative Care Outcomes Clarified goals of care, Counseled regarding hospice, Provided psychosocial or spiritual support, Changed CPR status, Completed durable DNR      Patient Active Problem List   Diagnosis Date Noted  . Palliative care by specialist   . Goals of care, counseling/discussion   . Encounter for hospice care discussion   . DNR (do not resuscitate)   . DNI (do not intubate)   . Symptomatic bradycardia 09/04/2020  . Dementia without behavioral disturbance (HCC) 09/04/2020  . Diabetes (HCC) 09/04/2020  . Essential hypertension 09/04/2020    Palliative Care Assessment & Plan   Patient Profile: 86 y.o. female  with past medical history of dementia, depression, diabetes, hyperlipidemia, and hypertension presented to the ED on 09/04/2020 for evaluation of syncopal episode and bradycardia. While on telemetry monitoring in ED, patient experienced another syncopal episode. Review of telemetry showed that the patient experienced a prolonged sinus pause. Cardiology consult was placed and she was placed for admission on 09/05/11.   Assessment: Bradycardia Sinus node dysfunction with slowing and prolonged pauses Syncope Falls Dementia Hyperlipidemia Diabetes mellitus Hypertension Generalized weakness   Recommendations/Plan:  Continue full scope medical treatment  Continue DNR/DNI as previously documented  Patient/family are agreeable to pacemaker placement today  Family wants patient discharge to rehab while coordination for  LTC/transport to 09/07/11 is arranged - TOC aware  TOC notified and consult placed for outpatient Palliative Care referral  PMT will continue to follow peripherally.  If there are any imminent needs please call the service directly  Goals of Care and Additional Recommendations:  Limitations on Scope of Treatment: Full Scope Treatment  Code Status:    Code Status Orders  (From admission, onward)         Start     Ordered   09/05/20 2003  Do not attempt resuscitation (DNR)  Continuous       Question Answer Comment  In the event of cardiac or respiratory ARREST Do not call a "code blue"   In the event of cardiac or respiratory ARREST Do not perform Intubation, CPR, defibrillation or ACLS   In the event of cardiac or respiratory ARREST Use medication by any route, position, wound care, and other measures to relive pain and suffering. May use oxygen, suction and manual treatment of airway obstruction as needed for comfort.      09/05/20 2002        Code Status History    Date Active Date Inactive Code Status Order ID Comments User Context   09/04/2020 1952 09/05/2020 2002 Full Code [id number]  Rometta Emery, MD ED   Advance Care Planning Activity       Prognosis:   Unable to determine  Discharge Planning:  Skilled Nursing Facility for rehab with Palliative care service follow-up  Care plan was discussed with primary RN, Bridgette/daughter-in-law, TOC, Dr. Jonathon Bellows  Thank you for allowing the Palliative Medicine Team to assist in the care of this patient.   Total Time 30 minutes Prolonged Time Billed  no       Greater than 50%  of this time was spent counseling and coordinating care related to the above assessment and plan.  Haskel Khan, NP  Please contact Palliative Medicine Team phone at [phone] for questions and concerns.

## 2020-09-06 NOTE — Discharge Instructions (Signed)
After Your Pacemaker    You have a [employer] Jude Pacemaker  ACTIVITY  Do not lift your arm above shoulder height for 1 week after your procedure. After 7 days, you may progress as below.     Friday September 13, 2020  Saturday September 14, 2020 Sunday September 15, 2020 Monday September 16, 2020    Do not lift, push, pull, or carry anything over 10 pounds with the affected arm until 6 weeks (Friday October 18, 2020 ) after your procedure.    Do NOT DRIVE until you have been seen for your wound check, or as long as instructed by your healthcare provider.    Ask your healthcare provider when you can go back to work   INCISION/Dressing  If you are on a blood thinner such as Coumadin, Xarelto, Eliquis, Plavix, or Pradaxa please confirm with your provider when this should be resumed.    Monitor your Pacemaker site for redness, swelling, and drainage. Call the device clinic at [phone] if you experience these symptoms or fever/chills.   If your incision is sealed with Steri-strips or staples, you may shower 10 days after your procedure or when told by your provider. Do not remove the steri-strips or let the shower hit directly on your site. You may wash around your site with soap and water.     Avoid lotions, ointments, or perfumes over your incision until it is well-healed.   You may use a hot tub or a pool AFTER your wound check appointment if the incision is completely closed.   PAcemaker Alerts:  Some alerts are vibratory and others beep. These are NOT emergencies. Please call our office to let us know. If this occurs at night or on weekends, it can wait until the next business day. Send a remote transmission.   If your device is capable of reading fluid status (for heart failure), you will be offered monthly monitoring to review this with you.   DEVICE MANAGEMENT  Remote monitoring is used to monitor your pacemaker from home. This monitoring is scheduled every 91 days by our office.  It allows [place] to keep an eye on the functioning of your device to ensure it is working properly. You will routinely see your Electrophysiologist annually (more often if necessary).    You should receive your ID card for your new device in 4-8 weeks. Keep this card with you at all times once received. Consider wearing a medical alert bracelet or necklace.   Your Pacemaker may be MRI compatible. This will be discussed at your next office visit/wound check.  You should avoid contact with strong electric or magnetic fields.    Do not use amateur (ham) radio equipment or electric (arc) welding torches. MP3 player headphones with magnets should not be used. Some devices are safe to use if held at least 12 inches (30 cm) from your Pacemaker. These include power tools, lawn mowers, and speakers. If you are unsure if something is safe to use, ask your health care provider.   When using your cell phone, hold it to the ear that is on the opposite side from the Pacemaker. Do not leave your cell phone in a pocket over the Pacemaker.   You may safely use electric blankets, heating pads, computers, and microwave ovens.  Call the office right away if:  You have chest pain.  You feel more short of breath than you have felt before.  You feel more light-headed than you have felt before.  Your incision starts to open up.  This information is not intended to replace advice given to you by your health care provider. Make sure you discuss any questions you have with your health care provider.

## 2020-09-06 NOTE — Progress Notes (Signed)
Patient's HR went to 0 on telemetry. RN ran to check on patient and she says she didn't feel any different. Patient may have been sleeping, but did not endorse any weakness, dizziness, or feeling faint.

## 2020-09-06 NOTE — Progress Notes (Signed)
Md in see patient explain procedure to patient writer obtained consent for pacemaker implantation to go for procedure today around 3 PM daughter at bedside aware. Patient remains NPO for procedure. PCR screen sent to lab for processing. Bath to be completed via CNA. No further changes noted.

## 2020-09-06 NOTE — NC FL2 (Signed)
[place] MEDICAID FL2 LEVEL OF CARE SCREENING TOOL     IDENTIFICATION  Patient Name: Angelica Lane Birthdate: Nov 21, 1928 Sex: female Admission Date (Current Location): 09/04/2020  [facility] and [place] Number:  Producer, television/film/video and Address:  The . [facility], [address] [place], [place] [address]      Provider Number: [id number]  Attending Physician Name and Address:  Lanae Boast, MD  Relative Name and Phone Number:  Bartholome Bill) [phone]    Current Level of Care: Hospital Recommended Level of Care: Skilled Nursing Facility Prior Approval Number:    Date Approved/Denied:   PASRR Number: [id number]  Discharge Plan: SNF    Current Diagnoses: Patient Active Problem List   Diagnosis Date Noted  . Palliative care by specialist   . Goals of care, counseling/discussion   . Encounter for hospice care discussion   . DNR (do not resuscitate)   . DNI (do not intubate)   . Symptomatic bradycardia 09/04/2020  . Dementia without behavioral disturbance (HCC) 09/04/2020  . Diabetes (HCC) 09/04/2020  . Essential hypertension 09/04/2020    Orientation RESPIRATION BLADDER Height & Weight     Self, Time, Situation, Place  Normal External catheter Weight: 110 lb (49.9 kg) Height:  4\' 11"  (149.9 cm)  BEHAVIORAL SYMPTOMS/MOOD NEUROLOGICAL BOWEL NUTRITION STATUS      Continent Diet (See DC summary)  AMBULATORY STATUS COMMUNICATION OF NEEDS Skin   Extensive Assist Verbally Normal                       Personal Care Assistance Level of Assistance  Bathing, Feeding, Dressing Bathing Assistance: Maximum assistance Feeding assistance: Limited assistance Dressing Assistance: Limited assistance     Functional Limitations Info  Sight, Hearing Sight Info: Impaired Hearing Info: Impaired      SPECIAL CARE FACTORS FREQUENCY  OT (By licensed OT), PT (By licensed PT)     PT Frequency: 5X per week OT Frequency: 5X per week             Contractures      Additional Factors Info  Code Status, Allergies, Insulin Sliding Scale Code Status Info: DNR Allergies Info: NKA   Insulin Sliding Scale Info: See DC summary       Current Medications (09/06/2020):  This is the current hospital active medication list Current Facility-Administered Medications  Medication Dose Route Frequency Provider Last Rate Last Admin  . 0.9 %  sodium chloride infusion   Intravenous Continuous 09/08/2020, MD 50 mL/hr at 09/06/20 1122 New Bag at 09/06/20 1122  . 0.9 %  sodium chloride infusion   Intravenous Continuous 09/08/20, PA-C      . 0.9 %  sodium chloride infusion   Intravenous Continuous Graciella Freer, PA-C      . acetaminophen (TYLENOL) tablet 650 mg  650 mg Oral Q6H PRN Graciella Freer, MD   650 mg at 09/05/20 2106   Or  . acetaminophen (TYLENOL) suppository 650 mg  650 mg Rectal Q6H PRN 2107, MD      . ceFAZolin (ANCEF) IVPB 2g/100 mL premix  2 g Intravenous To Cath Rometta Emery, PA-C      . chlorhexidine (HIBICLENS) 4 % liquid           . gentamicin (GARAMYCIN) 80 mg in sodium chloride 0.9 % 500 mL irrigation  80 mg Irrigation To Cath Graciella Freer, PA-C      . hydrALAZINE (APRESOLINE)  tablet 25 mg  25 mg Oral Q8H Duke Salvia, MD   25 mg at 09/06/20 1118  . [START ON 09/07/2020] influenza vaccine adjuvanted (FLUAD) injection 0.5 mL  0.5 mL Intramuscular Tomorrow-1000 Kc, Ramesh, MD      . insulin aspart (novoLOG) injection 0-5 Units  0-5 Units Subcutaneous QHS Garba, Mohammad L, MD      . insulin aspart (novoLOG) injection 0-9 Units  0-9 Units Subcutaneous TID WC Rometta Emery, MD   2 Units at 09/06/20 1251  . ondansetron (ZOFRAN) tablet 4 mg  4 mg Oral Q6H PRN Rometta Emery, MD       Or  . ondansetron (ZOFRAN) injection 4 mg  4 mg Intravenous Q6H PRN Mikeal Hawthorne, Mohammad L, MD      . sodium chloride flush (NS) 0.9 % injection 3 mL  3 mL Intravenous Q12H Rometta Emery, MD   3 mL at 09/06/20 1124     Discharge Medications: Please see discharge summary for a list of discharge medications.  Relevant Imaging Results:  Relevant Lab Results:   Additional Information SSN: [id number]  [place] Pink, Student-Social Work

## 2020-09-07 ENCOUNTER — Inpatient Hospital Stay (HOSPITAL_COMMUNITY): Payer: Medicare Other

## 2020-09-07 DIAGNOSIS — I495 Sick sinus syndrome: Principal | ICD-10-CM

## 2020-09-07 LAB — BASIC METABOLIC PANEL
Anion gap: 11 (ref 5–15)
BUN: 21 mg/dL (ref 8–23)
CO2: 19 mmol/L — ABNORMAL LOW (ref 22–32)
Calcium: 9.6 mg/dL (ref 8.9–10.3)
Chloride: 108 mmol/L (ref 98–111)
Creatinine, Ser: 1.09 mg/dL — ABNORMAL HIGH (ref 0.44–1.00)
GFR calc Af Amer: 52 mL/min — ABNORMAL LOW (ref >60)
GFR calc non Af Amer: 45 mL/min — ABNORMAL LOW (ref >60)
Glucose, Bld: 194 mg/dL — ABNORMAL HIGH (ref 70–99)
Potassium: 4.1 mmol/L (ref 3.5–5.1)
Sodium: 138 mmol/L (ref 135–145)

## 2020-09-07 LAB — GLUCOSE, CAPILLARY
Glucose-Capillary: 127 mg/dL — ABNORMAL HIGH (ref 70–99)
Glucose-Capillary: 179 mg/dL — ABNORMAL HIGH (ref 70–99)
Glucose-Capillary: 100 mg/dL — ABNORMAL HIGH (ref 70–99)
Glucose-Capillary: 233 mg/dL — ABNORMAL HIGH (ref 70–99)

## 2020-09-07 MED ORDER — LABETALOL HCL 5 MG/ML IV SOLN
10.0000 mg | INTRAVENOUS | Status: DC | PRN
  Administered 2020-09-07: 10 mg via INTRAVENOUS
  Filled 2020-09-07: qty 4

## 2020-09-07 MED ORDER — CARVEDILOL 6.25 MG PO TABS
6.2500 mg | ORAL_TABLET | Freq: Two times a day (BID) | ORAL | Status: DC
  Administered 2020-09-07 – 2020-09-09 (×4): 6.25 mg via ORAL
  Filled 2020-09-07 (×4): qty 1

## 2020-09-07 MED ORDER — AMLODIPINE BESYLATE 10 MG PO TABS
10.0000 mg | ORAL_TABLET | Freq: Every day | ORAL | Status: DC
  Administered 2020-09-07 – 2020-09-09 (×3): 10 mg via ORAL
  Filled 2020-09-07 (×3): qty 1

## 2020-09-07 NOTE — Progress Notes (Signed)
Occupational Therapy Evaluation  Patient with functional deficits listed below impacting safety and independence with self care. Per chart patient has history of dementia. Able to state month, year, hospital however confused regarding surgery initially stating it was done on her shoulder. Educate patient on pacemaker precautions, adjusted sling however patient still requires max cues not to push/pull with L UE during mobility/transfers. Patient mod A for bed mobility, mod A stand pivot to recliner and BSC with max cues sequencing and total A for peri care. Recommend continued acute OT services for ADL and transfer training, increase activity tolerance, safety awareness in order to facilitate D/C to venue listed below.    09/07/20 1407  OT Visit Information  Last OT Received On 09/07/20  Assistance Needed +1 (+2 to progress ambulation)  History of Present Illness Patient is a 86 year old female with a past medical history significant for moderate dementia and falls.  Patient admitted for syncope and found to have Sinus node dysfunction with slowing and prolonged pauses. Patient is s/p PPM 9/24  Precautions  Precautions ICD/Pacemaker  Precaution Comments educated patient on pacemaker precautions  Required Braces or Orthoses Sling  Home Living  Family/patient expects to be discharged to: Private residence  Living Arrangements Children (son)  Available Help at Discharge Family;Available PRN/intermittently  Type of Home House  Home Access Level entry  Home Layout Two level;Able to live on main level with bedroom/bathroom  Health and safety inspector - 4 wheels;Shower seat;Grab bars - tub/shower;BSC  Additional Comments will need to confirm info with family as patient has history of demenita  Prior Function  Level of Independence Independent with assistive device(s)  Comments in house no AD, outside brings walker  Communication   Communication HOH  Pain Assessment  Pain Assessment No/denies pain  Cognition  Arousal/Alertness Awake/alert  Behavior During Therapy WFL for tasks assessed/performed  Overall Cognitive Status No family/caregiver present to determine baseline cognitive functioning  General Comments patient knows month, year, that she's in the hospital but doesn't know the name. when asked about her surgery yesterday she states "it was on my shoulder" Patient has history of dementia, likely close to baseline  Upper Extremity Assessment  Upper Extremity Assessment LUE deficits/detail;Generalized weakness  LUE Unable to fully assess due to immobilization  Lower Extremity Assessment  Lower Extremity Assessment Defer to PT evaluation  ADL  Overall ADL's  Needs assistance/impaired  Grooming Wash/dry face;Set up;Sitting  Upper Body Bathing Moderate assistance;Sitting  Lower Body Bathing Total assistance;Sitting/lateral leans;Sit to/from stand  Upper Body Dressing  Moderate assistance;Sitting  Lower Body Dressing Total assistance;Sitting/lateral leans;Sit to/from stand  Toilet Transfer Moderate assistance;Cueing for safety;Cueing for sequencing;Stand-pivot;BSC  Toilet Transfer Details (indicate cue type and reason) cues not to psuh through L UE  Toileting- Clothing Manipulation and Hygiene Total assistance;Sit to/from stand  Functional mobility during ADLs Moderate assistance  General ADL Comments patient requiring increased assistance for self care due to decreased strength, activity tolerance, safety awareness, balance, and precautions  Bed Mobility  Overal bed mobility Needs Assistance  Bed Mobility Supine to Sit  Supine to sit Mod assist;HOB elevated  General bed mobility comments for trunk support, cues not to push with L UE  Transfers  Overall transfer level Needs assistance  Equipment used None  Transfers Sit to/from Stand;Stand Pivot Transfers  Sit to Stand Mod assist  Stand pivot transfers Mod  assist  General transfer comment pt transfer EOB to Recliner, recliner to [facility] and  back to chair. Patient does assist with powering up cues to use R UE only, requires mod A to safely complete stand pivot transfers with cues for sequencing  Balance  Overall balance assessment Needs assistance  Sitting-balance support Feet supported  Sitting balance-Leahy Scale Fair  Standing balance support Single extremity supported  Standing balance-Leahy Scale Poor  Standing balance comment requires mod A to safely transfer, holds onto therapist with R UE  OT - End of Session  Equipment Utilized During Treatment Gait belt;Other (comment) (sling)  Activity Tolerance Patient tolerated treatment well  Patient left in chair;with call bell/phone within reach;with chair alarm set  Nurse Communication Mobility status  OT Assessment  OT Recommendation/Assessment Patient needs continued OT Services  OT Visit Diagnosis Unsteadiness on feet (R26.81);Other abnormalities of gait and mobility (R26.89);Muscle weakness (generalized) (M62.81);History of falling (Z91.81)  OT Problem List Decreased activity tolerance;Impaired balance (sitting and/or standing);Decreased safety awareness;Decreased knowledge of use of DME or AE;Decreased knowledge of precautions  OT Plan  OT Frequency (ACUTE ONLY) Min 2X/week  OT Treatment/Interventions (ACUTE ONLY) Self-care/ADL training;Therapeutic exercise;DME and/or AE instruction;Therapeutic activities;Patient/family education;Balance training;Cognitive remediation/compensation  AM-PAC OT "6 Clicks" Daily Activity Outcome Measure (Version 2)  Help from another person eating meals? 3  Help from another person taking care of personal grooming? 3  Help from another person toileting, which includes using toliet, bedpan, or urinal? 2  Help from another person bathing (including washing, rinsing, drying)? 2  Help from another person to put on and taking off regular upper body clothing? 2  Help  from another person to put on and taking off regular lower body clothing? 1  6 Click Score 13  OT Recommendation  Follow Up Recommendations SNF  OT Equipment Other (comment) (defer to next venue)  Individuals Consulted  Consulted and Agree with Results and Recommendations Patient  Acute Rehab OT Goals  Patient Stated Goal "I need to use the potty"  OT Goal Formulation With patient  Time For Goal Achievement 09/21/20  Potential to Achieve Goals Good  OT Time Calculation  OT Start Time (ACUTE ONLY) 1315  OT Stop Time (ACUTE ONLY) 1354  OT Time Calculation (min) 39 min  OT General Charges  $OT Visit 1 Visit  OT Evaluation  $OT Eval Moderate Complexity 1 Mod  OT Treatments  $Self Care/Home Management  23-37 mins  Written Expression  Dominant Hand Right   Marlyce Huge OT OT pager: [phone]

## 2020-09-07 NOTE — TOC Progression Note (Signed)
Transition of Care [facility]) - Progression Note    Patient Details  Name: Angelica Lane MRN: [id number] Date of Birth: Dec 13, 1928  [facility]) CM/SW Contact  Patrice Paradise, [place] Phone Number: [phone] 09/07/2020, 4:12 PM  Clinical Narrative:     CSW received a return call from duaghter-in law and son and they stated that they would like to move forward with [facility]. They inquired about [facility] and CSW informed them that a bed offer had not been extended.  CSW reached out to [facility] and was able to connect with [place] and she informed CSW that they would have beds on Monday.  CSW alerted MD that new COVID would be needed.   TOC team will continue to assist with discharge planning needs.  Expected Discharge Plan: Skilled Nursing Facility Barriers to Discharge: Continued Medical Work up, English as a second language teacher  Expected Discharge Plan and Services Expected Discharge Plan: Skilled Nursing Facility     Post Acute Care Choice: Skilled Nursing Facility                               [facility] Agency: NA         Social Determinants of Health (SDOH) Interventions    Readmission Risk Interventions No flowsheet data found.

## 2020-09-07 NOTE — Progress Notes (Signed)
Progress Note  Patient Name: Angelica Lane Date of Encounter: 09/07/2020  Primary Cardiologist: No primary care provider on file.   Subjective   No complaints today. Denies chest pain or incisional pain.  Inpatient Medications    Scheduled Meds: . Chlorhexidine Gluconate Cloth  6 each Topical Q0600  . hydrALAZINE  25 mg Oral Q8H  . insulin aspart  0-5 Units Subcutaneous QHS  . insulin aspart  0-9 Units Subcutaneous TID WC  . mupirocin ointment  1 application Nasal BID  . sodium chloride flush  3 mL Intravenous Q12H   Continuous Infusions: . sodium chloride 50 mL/hr at 09/07/20 0900   PRN Meds: acetaminophen **OR** acetaminophen, acetaminophen, labetalol, ondansetron **OR** ondansetron (ZOFRAN) IV   Vital Signs    Vitals:   09/06/20 2353 09/07/20 0418 09/07/20 0419 09/07/20 0626  BP: (!) 154/120 (!) 219/76  (!) 177/74  Pulse: 66 72  68  Resp: 20 18  18   Temp: 98.3 F (36.8 C) 97.8 F (36.6 C)  97.8 F (36.6 C)  TempSrc: Oral Oral  Oral  SpO2: 99% 99%  97%  Weight:   45.4 kg   Height:        Intake/Output Summary (Last 24 hours) at 09/07/2020 1106 Last data filed at 09/07/2020 0900 Gross per 24 hour  Intake 1564.25 ml  Output 1850 ml  Net -285.75 ml   Filed Weights   09/04/20 1551 09/06/20 0501 09/07/20 0419  Weight: 49.4 kg 49.9 kg 45.4 kg    Telemetry    nsr - Personally Reviewed  ECG    none - Personally Reviewed  Physical Exam   GEN: No acute distress.   Neck: No JVD Cardiac: RRR, no murmurs, rubs, or gallops.  Respiratory: Clear to auscultation bilaterally. No hematoma GI: Soft, nontender, non-distended  MS: No edema; No deformity. Neuro:  Nonfocal  Psych: Normal affect   Labs    Chemistry Recent Labs  Lab 09/04/20 1621 09/04/20 1629 09/05/20 0500 09/05/20 1309 09/05/20 2137  NA 139   < > 140 143 140  K 5.9*   < > 3.2* 2.8* 4.5  CL 107   < > 111 120* 109  CO2 22   < > 16* 14* 21*  GLUCOSE 177*   < > 155* 100* 163*  BUN 31*    < > 21 13 16   CREATININE 1.38*   < > 0.98 0.63 0.96  CALCIUM 10.0   < > 8.0* 6.9* 9.7  PROT 6.8  --  5.6*  --   --   ALBUMIN 3.8  --  3.1*  --   --   AST 26  --  25  --   --   ALT 22  --  19  --   --   ALKPHOS 82  --  63  --   --   BILITOT 0.5  --  0.4  --   --   GFRNONAA 34*   < > 51* >60 52*  GFRAA 39*   < > 59* >60 >60  ANIONGAP 10   < > 13 9 10    < > = values in this interval not displayed.     Hematology Recent Labs  Lab 09/04/20 1621 09/04/20 1629 09/05/20 0500  WBC 4.3  --  6.0  RBC 3.72*  --  3.48*  HGB 11.5* 11.2* 11.0*  HCT 36.2 33.0* 34.7*  MCV 97.3  --  99.7  MCH 30.9  --  31.6  MCHC 31.8  --  31.7  RDW 13.1  --  13.1  PLT 276  --  226    Cardiac EnzymesNo results for input(s): TROPONINI in the last 168 hours. No results for input(s): TROPIPOC in the last 168 hours.   BNPNo results for input(s): BNP, PROBNP in the last 168 hours.   DDimer No results for input(s): DDIMER in the last 168 hours.   Radiology    EP PPM/ICD IMPLANT  Result Date: 09/06/2020 CONCLUSIONS:  1. Successful implantation of a St. Jude single-chamber pacemaker for symptomatic bradycardia due to sinus node dysfunction  2. No early apparent complications.       Lewayne Bunting, MD 09/06/2020 4:51 PM   DG Chest Port 1 View  Result Date: 09/07/2020 CLINICAL DATA:  Pacemaker placement. EXAM: PORTABLE CHEST 1 VIEW COMPARISON:  09/04/2020 FINDINGS: The cardiomediastinal silhouette is unremarkable. A single lead LEFT-sided pacemaker is now noted. There is no evidence of pneumothorax. No pleural effusion. Minimal bibasilar atelectasis noted. IMPRESSION: LEFT pacemaker placement.  No pneumothorax. Electronically Signed   By: Harmon Pier M.D.   On: 09/07/2020 08:07   ECHOCARDIOGRAM COMPLETE  Result Date: 09/05/2020    ECHOCARDIOGRAM REPORT   Patient Name:   Angelica Lane Date of Exam: 09/05/2020 Medical Rec #:  [id number]     Height:       59.0 in Accession #:    [id number]    Weight:       109.0 lb  Date of Birth:  1928-05-19      BSA:          1.425 m Patient Age:    86 years      BP:           156/57 mmHg Patient Gender: F             HR:           58 bpm. Exam Location:  Inpatient Procedure: 2D Echo, Cardiac Doppler and Color Doppler Indications:    Syncope 780.2 / R55  History:        Patient has no prior history of Echocardiogram examinations.                 Risk Factors:Hypertension, Diabetes, Dyslipidemia and                 Non-Smoker.  Sonographer:    Renella Cunas RDCS Referring Phys: [id number] MOHAMMAD L GARBA IMPRESSIONS  1. Left ventricular ejection fraction, by estimation, is 60 to 65%. The left ventricle has normal function. The left ventricle has no regional wall motion abnormalities. Left ventricular diastolic parameters are consistent with Grade I diastolic dysfunction (impaired relaxation).  2. Right ventricular systolic function is normal. The right ventricular size is normal.  3. The mitral valve is grossly normal. No evidence of mitral valve regurgitation. No evidence of mitral stenosis.  4. The aortic valve is calcified. Aortic valve regurgitation is not visualized. No aortic stenosis is present. FINDINGS  Left Ventricle: Left ventricular ejection fraction, by estimation, is 60 to 65%. The left ventricle has normal function. The left ventricle has no regional wall motion abnormalities. The left ventricular internal cavity size was normal in size. There is  no left ventricular hypertrophy. Left ventricular diastolic parameters are consistent with Grade I diastolic dysfunction (impaired relaxation). Right Ventricle: The right ventricular size is normal. No increase in right ventricular wall thickness. Right ventricular systolic function is normal. Left Atrium: Left atrial size was normal in size. Right Atrium: Right atrial size was  normal in size. Pericardium: There is no evidence of pericardial effusion. Mitral Valve: The mitral valve is grossly normal. No evidence of mitral valve regurgitation.  No evidence of mitral valve stenosis. Tricuspid Valve: The tricuspid valve is grossly normal. Tricuspid valve regurgitation is not demonstrated. Aortic Valve: The aortic valve is calcified. Aortic valve regurgitation is not visualized. No aortic stenosis is present. Pulmonic Valve: The pulmonic valve was normal in structure. Pulmonic valve regurgitation is not visualized. Aorta: The aortic root and ascending aorta are structurally normal, with no evidence of dilitation. IAS/Shunts: The atrial septum is grossly normal.  LEFT VENTRICLE PLAX 2D LVIDd:         4.40 cm     Diastology LVIDs:         3.20 cm     LV e' medial:    5.22 cm/s LV PW:         0.70 cm     LV E/e' medial:  14.9 LV IVS:        0.70 cm     LV e' lateral:   5.77 cm/s LVOT diam:     1.90 cm     LV E/e' lateral: 13.4 LV SV:         74 LV SV Index:   52 LVOT Area:     2.84 cm  LV Volumes (MOD) LV vol d, MOD A2C: 69.8 ml LV vol d, MOD A4C: 67.9 ml LV vol s, MOD A2C: 23.2 ml LV vol s, MOD A4C: 29.0 ml LV SV MOD A2C:     46.6 ml LV SV MOD A4C:     67.9 ml LV SV MOD BP:      42.6 ml RIGHT VENTRICLE RV S prime:     9.90 cm/s TAPSE (M-mode): 1.8 cm LEFT ATRIUM             Index       RIGHT ATRIUM          Index LA diam:        3.70 cm 2.60 cm/m  RA Area:     7.77 cm LA Vol (A2C):   22.1 ml 15.51 ml/m RA Volume:   12.10 ml 8.49 ml/m LA Vol (A4C):   24.3 ml 17.05 ml/m LA Biplane Vol: 25.0 ml 17.54 ml/m  AORTIC VALVE LVOT Vmax:   113.00 cm/s LVOT Vmean:  79.800 cm/s LVOT VTI:    0.262 m  AORTA Ao Root diam: 2.70 cm MITRAL VALVE MV Area (PHT): 2.62 cm     SHUNTS MV Decel Time: 289 msec     Systemic VTI:  0.26 m MV E velocity: 77.60 cm/s   Systemic Diam: 1.90 cm MV A velocity: 110.00 cm/s MV E/A ratio:  0.71 Kristeen MissPhilip Nahser MD Electronically signed by Kristeen MissPhilip Nahser MD Signature Date/Time: 09/05/2020/12:29:07 PM    Final     Cardiac Studies   none  Patient Profile     86 y.o. female with dementia admitted with syncope and found to have long  pauses.  Assessment & Plan    1. Sinus node dysfunction - she is asymptomatic s/p PPM insertion. 2. PPM - her St. Jude single chamber PPM is working normally. 3. Disp. - from our perspective she is stable for dc home with PPM wound check in 10-14 days. Keep incision dry for a week.  [facility] will sign off.   Medication Recommendations:  none Other recommendations (labs, testing, etc):  See above Follow up as an outpatient:  As  above.  For questions or updates, please contact [facility] Please consult [email or web address] for contact info under Cardiology/STEMI.      Signed, Lewayne Bunting, MD  09/07/2020, 11:06 AM  Patient ID: Katherina Right, female   DOB: 1928/04/16, 86 y.o.   MRN: [id number]

## 2020-09-07 NOTE — TOC Progression Note (Signed)
Transition of Care [facility]) - Progression Note    Patient Details  Name: Angelica Lane MRN: [id number] Date of Birth: 10-21-1928  [facility] CM/SW Contact  Patrice Paradise, [place] Phone Number: [phone] 09/07/2020, 11:51 AM  Clinical Narrative:     CSW spoke with patient's son due to patient being sleep and provided him with the bed offers. He informed CSW that he would speak with patient and his wife to pick a bed choice. CSW left her phone number and requested a follow up call.  .TOC team will continue to assist with discharge planning needs.   Expected Discharge Plan: Skilled Nursing Facility Barriers to Discharge: Continued Medical Work up, English as a second language teacher  Expected Discharge Plan and Services Expected Discharge Plan: Skilled Nursing Facility     Post Acute Care Choice: Skilled Nursing Facility                               [facility] Agency: NA         Social Determinants of Health (SDOH) Interventions    Readmission Risk Interventions No flowsheet data found.

## 2020-09-07 NOTE — Progress Notes (Addendum)
PROGRESS NOTE    Angelica Lane  UTM:[id number] DOB: 1928-11-18 DOA: 09/04/2020 PCP: [place], MD   Chief Complaint  Patient presents with  . Bradycardia   Brief Narrative: Per HPI: 86 y.o. female with medical history significant of hypertension, hyperlipidemia, diabetes, dementia, generalized debility and depression admitted secondary to syncopal episode versus uncontrolled, felt unsteady and dizzy.  History of fall in the past thought to be secondary to orthostasis.  She is on carvedilol and clonidine for hypertension. She had fall about 2 weeks ago with facial trauma and was seen at [facility].  ED Course: Temperature is 97.8 blood pressure 116/53 pulse 48 respirate 21 oxygen sat 98% room air.  Potassium is 6.4 hemoglobin 11.2 otherwise chemistry also within normal.  She has a BUN of 29 creatinine 1.1 calcium 10.0 glucose 153.  Hemoglobin A1c of 6.8 troponin of 8.  TSH 1.112.  Chest x-ray showed no acute findings.  Patient being admitted with symptomatic bradycardia. Seen by EP-her carvedilol Namenda and Aricept has been discontinued and allowing for washout for beta-blocker. She is being monitored on telemetry 9/24- s/p PPM  Subjective:  Overnight blood pressure 170s to 200s. NSR' had PM No pain at PM site dressing intact.   Assessment & Plan:  Symptomatic bradycardia with syncope/sinus arrest: Intermittent episodes despite of carvedilol as well as Aricept Namenda and clonidine. EP cardiology on board and s/p PPM 9/24 evening.  Dementia without behavioral disturbance: Off Namenda Aricept due to bradycardia. Supportive care  Hyperlipidemia:  Cont statins/zetia  Diabetes mellitus, controlled hbA1c 6.8. Blood sugar well controlled on sliding scale. Monitor CBG. Recent Labs  Lab 09/06/20 0602 09/06/20 1220 09/06/20 1754 09/06/20 2153 09/07/20 0608  GLUCAP 118* 168* 126* 211* 127*   Lab Results  Component Value Date   HGBA1C 6.8 (H) 09/04/2020   Essential  hypertension: Poorly controlled, started on hydralazine given its reflex sympathetic action. may need further dose adjustment defer to cardiology. Spoke w/ Dr Ladona Ridgel from cardio- okay to resume her home amlodipine and coreg for BP control. Hold off on clonidine for now.  Hyperkalemia-resolved.  HypoKalemia was replaced and normalized.   Generalized debility: PT OT eval once stable from EP standpoint.  GOC: Palliative care input appreciated.  Patient is currently DNR with full scope of treatment.  Appreciate input.   DVT prophylaxis: SCDs Start: 09/06/20 1731 SCDs Start: 09/04/20 1952 Code Status:   Code Status: DNR  Family Communication: plan of care discussed with patient at bedside.  I discussed with patient's son and daughter in law over the phone 9/23, EPand palliative care has updated family.  Status is: Inpatient Remains inpatient appropriate because:Inpatient level of care appropriate due to severity of illness and Due to symptomatic bradycardia and syncope  Dispo: The patient is from: Home lives w/ family              Anticipated d/c is to: Short term SNF. PT/TO consult               Anticipated d/c date is: 1-2 days              Patient currently is not medically stable to d/c.  Nutrition: Diet Order            Diet Heart Room service appropriate? Yes; Fluid consistency: Thin  Diet effective now                  Body mass index is 20.2 kg/m.  Consultants:see note  Procedures:see note  Microbiology:see note Blood Culture    Component Value Date/Time   SDES URINE, RANDOM 10/25/2019 1602   SPECREQUEST  10/25/2019 1602    NONE Performed at [facility], [address], [place], [place] [address]    CULT MULTIPLE SPECIES PRESENT, SUGGEST RECOLLECTION (A) 10/25/2019 1602   REPTSTATUS 10/26/2019 FINAL 10/25/2019 1602    Other culture-see note  Medications: Scheduled Meds: . Chlorhexidine Gluconate Cloth  6 each Topical Q0600  . hydrALAZINE  25 mg Oral Q8H    . insulin aspart  0-5 Units Subcutaneous QHS  . insulin aspart  0-9 Units Subcutaneous TID WC  . mupirocin ointment  1 application Nasal BID  . sodium chloride flush  3 mL Intravenous Q12H   Continuous Infusions: . sodium chloride 50 mL/hr at 09/07/20 0502    Antimicrobials: Anti-infectives (From admission, onward)   Start     Dose/Rate Route Frequency Ordered Stop   09/07/20 0400  ceFAZolin (ANCEF) IVPB 1 g/50 mL premix        1 g 100 mL/hr over 30 Minutes Intravenous  Once 09/06/20 1730 09/07/20 0535   09/06/20 1130  gentamicin (GARAMYCIN) 80 mg in sodium chloride 0.9 % 500 mL irrigation        80 mg Irrigation To Cath Lab 09/06/20 1111 09/06/20 1635   09/06/20 1130  ceFAZolin (ANCEF) IVPB 2g/100 mL premix        2 g 200 mL/hr over 30 Minutes Intravenous To Cath Lab 09/06/20 1111 09/06/20 1643     Objective: Vitals: Today's Vitals   09/07/20 0200 09/07/20 0418 09/07/20 0419 09/07/20 0626  BP:  (!) 219/76  (!) 177/74  Pulse:  72  68  Resp:  18  18  Temp:  97.8 F (36.6 C)  97.8 F (36.6 C)  TempSrc:  Oral  Oral  SpO2:  99%  97%  Weight:   45.4 kg   Height:      PainSc: Asleep       Intake/Output Summary (Last 24 hours) at 09/07/2020 0936 Last data filed at 09/06/2020 2214 Gross per 24 hour  Intake 1151.97 ml  Output 1850 ml  Net -698.03 ml   Filed Weights   09/04/20 1551 09/06/20 0501 09/07/20 0419  Weight: 49.4 kg 49.9 kg 45.4 kg   Weight change: -4.536 kg  Intake/Output from previous day: 09/24 0701 - 09/25 0700 In: 1152 [P.O.:240; I.V.:712; IV Piggyback:200] Out: 1850 [Urine:1850] Intake/Output this shift: No intake/output data recorded.  Examination: General exam: AAOx2 , NAD, weak appearing. HEENT:Oral mucosa moist, Ear/Nose WNL grossly, dentition normal. Respiratory system: bilaterally clear,no wheezing or crackles,no use of accessory muscle. PM site dressing intact Cardiovascular system: S1 & S2 +, No JVD,. Gastrointestinal system: Abdomen soft,  NT,ND, BS+ Nervous System:Alert, awake, moving extremities and grossly nonfocal Extremities: No edema, distal peripheral pulses palpable.  Skin: No rashes,no icterus. Chronic skin changes MSK: Normal muscle bulk,tone, power  Data Reviewed: I have personally reviewed following labs and imaging studies CBC: Recent Labs  Lab 09/04/20 1621 09/04/20 1629 09/05/20 0500  WBC 4.3  --  6.0  NEUTROABS 2.6  --   --   HGB 11.5* 11.2* 11.0*  HCT 36.2 33.0* 34.7*  MCV 97.3  --  99.7  PLT 276  --  226   Basic Metabolic Panel: Recent Labs  Lab 09/04/20 1621 09/04/20 1621 09/04/20 1629 09/04/20 1936 09/05/20 0500 09/05/20 1309 09/05/20 2137  NA 139  --  141  --  140 143 140  K  5.9*   < > 5.0 6.4* 3.2* 2.8* 4.5  CL 107  --  110  --  111 120* 109  CO2 22  --   --   --  16* 14* 21*  GLUCOSE 177*  --  153*  --  155* 100* 163*  BUN 31*  --  29*  --  21 13 16   CREATININE 1.38*  --  1.10*  --  0.98 0.63 0.96  CALCIUM 10.0  --   --   --  8.0* 6.9* 9.7  MG 1.8  --   --   --   --   --   --    < > = values in this interval not displayed.   GFR: Estimated Creatinine Clearance: 26.6 mL/min (by C-G formula based on SCr of 0.96 mg/dL). Liver Function Tests: Recent Labs  Lab 09/04/20 1621 09/05/20 0500  AST 26 25  ALT 22 19  ALKPHOS 82 63  BILITOT 0.5 0.4  PROT 6.8 5.6*  ALBUMIN 3.8 3.1*   No results for input(s): LIPASE, AMYLASE in the last 168 hours. No results for input(s): AMMONIA in the last 168 hours. Coagulation Profile: No results for input(s): INR, PROTIME in the last 168 hours. Cardiac Enzymes: No results for input(s): CKTOTAL, CKMB, CKMBINDEX, TROPONINI in the last 168 hours. BNP (last 3 results) No results for input(s): PROBNP in the last 8760 hours. HbA1C: Recent Labs    09/04/20 1952  HGBA1C 6.8*   CBG: Recent Labs  Lab 09/06/20 0602 09/06/20 1220 09/06/20 1754 09/06/20 2153 09/07/20 0608  GLUCAP 118* 168* 126* 211* 127*   Lipid Profile: No results for  input(s): CHOL, HDL, LDLCALC, TRIG, CHOLHDL, LDLDIRECT in the last 72 hours. Thyroid Function Tests: Recent Labs    09/04/20 1621  TSH 1.112   Anemia Panel: No results for input(s): VITAMINB12, FOLATE, FERRITIN, TIBC, IRON, RETICCTPCT in the last 72 hours. Sepsis Labs: No results for input(s): PROCALCITON, LATICACIDVEN in the last 168 hours.  Recent Results (from the past 240 hour(s))  SARS Coronavirus 2 by RT PCR (hospital order, performed in [facility] hospital lab) Nasopharyngeal Nasopharyngeal Swab     Status: None   Collection Time: 09/04/20  6:29 PM   Specimen: Nasopharyngeal Swab  Result Value Ref Range Status   SARS Coronavirus 2 NEGATIVE NEGATIVE Final    Comment: (NOTE) SARS-CoV-2 target nucleic acids are NOT DETECTED.  The SARS-CoV-2 RNA is generally detectable in upper and lower respiratory specimens during the acute phase of infection. The lowest concentration of SARS-CoV-2 viral copies this assay can detect is 250 copies / mL. A negative result does not preclude SARS-CoV-2 infection and should not be used as the sole basis for treatment or other patient management decisions.  A negative result may occur with improper specimen collection / handling, submission of specimen other than nasopharyngeal swab, presence of viral mutation(s) within the areas targeted by this assay, and inadequate number of viral copies (<250 copies / mL). A negative result must be combined with clinical observations, patient history, and epidemiological information.  Fact Sheet for Patients:   09/06/20  Fact Sheet for Healthcare Providers: BoilerBrush.com.cy  This test is not yet approved or  cleared by the https://pope.com/ FDA and has been authorized for detection and/or diagnosis of SARS-CoV-2 by FDA under an Emergency Use Authorization (EUA).  This EUA will remain in effect (meaning this test can be used) for the duration of  the COVID-19 declaration under Section 564(b)(1) of the Act,  21 U.S.C. section 360bbb-3(b)(1), unless the authorization is terminated or revoked sooner.  Performed at [facility], [address] [place], [place] [address]   Surgical PCR screen     Status: Abnormal   Collection Time: 09/06/20 12:39 PM   Specimen: Nasal Mucosa; Nasal Swab  Result Value Ref Range Status   MRSA, PCR NEGATIVE NEGATIVE Final   Staphylococcus aureus POSITIVE (A) NEGATIVE Final    Comment: (NOTE) The Xpert SA Assay (FDA approved for NASAL specimens in patients 12 years of age and older), is one component of a comprehensive surveillance program. It is not intended to diagnose infection nor to guide or monitor treatment. Performed at [facility], [address], [place], [place] [address]      Radiology Studies: EP PPM/ICD IMPLANT  Result Date: 09/06/2020 CONCLUSIONS:  1. Successful implantation of a [employer] Jude single-chamber pacemaker for symptomatic bradycardia due to sinus node dysfunction  2. No early apparent complications.       Lewayne Bunting, MD 09/06/2020 4:51 PM   DG Chest Port 1 View  Result Date: 09/07/2020 CLINICAL DATA:  Pacemaker placement. EXAM: PORTABLE CHEST 1 VIEW COMPARISON:  09/04/2020 FINDINGS: The cardiomediastinal silhouette is unremarkable. A single lead LEFT-sided pacemaker is now noted. There is no evidence of pneumothorax. No pleural effusion. Minimal bibasilar atelectasis noted. IMPRESSION: LEFT pacemaker placement.  No pneumothorax. Electronically Signed   By: Harmon Pier M.D.   On: 09/07/2020 08:07   ECHOCARDIOGRAM COMPLETE  Result Date: 09/05/2020    ECHOCARDIOGRAM REPORT   Patient Name:   BRIGETT ESTELL Date of Exam: 09/05/2020 Medical Rec #:  [id number]     Height:       59.0 in Accession #:    [id number]    Weight:       109.0 lb Date of Birth:  Mar 23, 1928      BSA:          1.425 m Patient Age:    89 years      BP:           156/57 mmHg Patient Gender: F              HR:           58 bpm. Exam Location:  Inpatient Procedure: 2D Echo, Cardiac Doppler and Color Doppler Indications:    Syncope 780.2 / R55  History:        Patient has no prior history of Echocardiogram examinations.                 Risk Factors:Hypertension, Diabetes, Dyslipidemia and                 Non-Smoker.  Sonographer:    Renella Cunas RDCS Referring Phys: [id number] MOHAMMAD L GARBA IMPRESSIONS  1. Left ventricular ejection fraction, by estimation, is 60 to 65%. The left ventricle has normal function. The left ventricle has no regional wall motion abnormalities. Left ventricular diastolic parameters are consistent with Grade I diastolic dysfunction (impaired relaxation).  2. Right ventricular systolic function is normal. The right ventricular size is normal.  3. The mitral valve is grossly normal. No evidence of mitral valve regurgitation. No evidence of mitral stenosis.  4. The aortic valve is calcified. Aortic valve regurgitation is not visualized. No aortic stenosis is present. FINDINGS  Left Ventricle: Left ventricular ejection fraction, by estimation, is 60 to 65%. The left ventricle has normal function. The left ventricle has no regional wall motion abnormalities. The left  ventricular internal cavity size was normal in size. There is  no left ventricular hypertrophy. Left ventricular diastolic parameters are consistent with Grade I diastolic dysfunction (impaired relaxation). Right Ventricle: The right ventricular size is normal. No increase in right ventricular wall thickness. Right ventricular systolic function is normal. Left Atrium: Left atrial size was normal in size. Right Atrium: Right atrial size was normal in size. Pericardium: There is no evidence of pericardial effusion. Mitral Valve: The mitral valve is grossly normal. No evidence of mitral valve regurgitation. No evidence of mitral valve stenosis. Tricuspid Valve: The tricuspid valve is grossly normal. Tricuspid valve regurgitation is not  demonstrated. Aortic Valve: The aortic valve is calcified. Aortic valve regurgitation is not visualized. No aortic stenosis is present. Pulmonic Valve: The pulmonic valve was normal in structure. Pulmonic valve regurgitation is not visualized. Aorta: The aortic root and ascending aorta are structurally normal, with no evidence of dilitation. IAS/Shunts: The atrial septum is grossly normal.  LEFT VENTRICLE PLAX 2D LVIDd:         4.40 cm     Diastology LVIDs:         3.20 cm     LV e' medial:    5.22 cm/s LV PW:         0.70 cm     LV E/e' medial:  14.9 LV IVS:        0.70 cm     LV e' lateral:   5.77 cm/s LVOT diam:     1.90 cm     LV E/e' lateral: 13.4 LV SV:         74 LV SV Index:   52 LVOT Area:     2.84 cm  LV Volumes (MOD) LV vol d, MOD A2C: 69.8 ml LV vol d, MOD A4C: 67.9 ml LV vol s, MOD A2C: 23.2 ml LV vol s, MOD A4C: 29.0 ml LV SV MOD A2C:     46.6 ml LV SV MOD A4C:     67.9 ml LV SV MOD BP:      42.6 ml RIGHT VENTRICLE RV S prime:     9.90 cm/s TAPSE (M-mode): 1.8 cm LEFT ATRIUM             Index       RIGHT ATRIUM          Index LA diam:        3.70 cm 2.60 cm/m  RA Area:     7.77 cm LA Vol (A2C):   22.1 ml 15.51 ml/m RA Volume:   12.10 ml 8.49 ml/m LA Vol (A4C):   24.3 ml 17.05 ml/m LA Biplane Vol: 25.0 ml 17.54 ml/m  AORTIC VALVE LVOT Vmax:   113.00 cm/s LVOT Vmean:  79.800 cm/s LVOT VTI:    0.262 m  AORTA Ao Root diam: 2.70 cm MITRAL VALVE MV Area (PHT): 2.62 cm     SHUNTS MV Decel Time: 289 msec     Systemic VTI:  0.26 m MV E velocity: 77.60 cm/s   Systemic Diam: 1.90 cm MV A velocity: 110.00 cm/s MV E/A ratio:  0.71 Kristeen MissPhilip Nahser MD Electronically signed by Kristeen MissPhilip Nahser MD Signature Date/Time: 09/05/2020/12:29:07 PM    Final      LOS: 3 days   Lanae Boastamesh Tavaria Mackins, MD [facility]  09/07/2020, 9:36 AM

## 2020-09-08 LAB — BASIC METABOLIC PANEL
Anion gap: 12 (ref 5–15)
BUN: 18 mg/dL (ref 8–23)
CO2: 19 mmol/L — ABNORMAL LOW (ref 22–32)
Calcium: 9.5 mg/dL (ref 8.9–10.3)
Chloride: 109 mmol/L (ref 98–111)
Creatinine, Ser: 0.95 mg/dL (ref 0.44–1.00)
GFR calc Af Amer: >60 mL/min (ref >60)
GFR calc non Af Amer: 53 mL/min — ABNORMAL LOW (ref >60)
Glucose, Bld: 163 mg/dL — ABNORMAL HIGH (ref 70–99)
Potassium: 4.1 mmol/L (ref 3.5–5.1)
Sodium: 140 mmol/L (ref 135–145)

## 2020-09-08 LAB — SARS CORONAVIRUS 2 (TAT 6-24 HRS): SARS Coronavirus 2: NEGATIVE

## 2020-09-08 LAB — GLUCOSE, CAPILLARY
Glucose-Capillary: 153 mg/dL — ABNORMAL HIGH (ref 70–99)
Glucose-Capillary: 212 mg/dL — ABNORMAL HIGH (ref 70–99)
Glucose-Capillary: 178 mg/dL — ABNORMAL HIGH (ref 70–99)
Glucose-Capillary: 235 mg/dL — ABNORMAL HIGH (ref 70–99)

## 2020-09-08 NOTE — Progress Notes (Signed)
Brief Palliative Care Progress Note:  Chart review performed. Noted that outpatient Palliative Care had not yet been set up per family's request - patient is likely discharging Monday 09/09/20. TOC consult was placed 09/06/20 for referral. Reached out to Judd Lien, LCSW to ensure patient/family was referred and connected before discharge.   Please ensure referral is completed before patient is discharged.  Thank you for allowing [facility] to assist in the care of this patient.  Priscilla Finklea M. Katrinka Blazing [facility] Palliative Medicine Team Team Phone: [phone]  NO CHARGE

## 2020-09-08 NOTE — Progress Notes (Signed)
PROGRESS NOTE    Angelica Lane  MPN:[id number] DOB: 1928-06-26 DOA: 09/04/2020 PCP: [place], MD   Chief Complaint  Patient presents with  . Bradycardia   Brief Narrative: Per HPI: 86 y.o. female with medical history significant of hypertension, hyperlipidemia, diabetes, dementia, generalized debility and depression admitted secondary to syncopal episode versus uncontrolled, felt unsteady and dizzy.  History of fall in the past thought to be secondary to orthostasis.  She is on carvedilol and clonidine for hypertension. She had fall about 2 weeks ago with facial trauma and was seen at [facility].  ED Course: Temperature is 97.8 blood pressure 116/53 pulse 48 respirate 21 oxygen sat 98% room air.  Potassium is 6.4 hemoglobin 11.2 otherwise chemistry also within normal.  She has a BUN of 29 creatinine 1.1 calcium 10.0 glucose 153.  Hemoglobin A1c of 6.8 troponin of 8.  TSH 1.112.  Chest x-ray showed no acute findings.  Patient being admitted with symptomatic bradycardia. Seen by EP-her carvedilol Namenda and Aricept has been discontinued and allowing for washout for beta-blocker. She is being monitored on telemetry 9/24- s/p PPM, doing well post PPM family desiring SNF  Subjective: Alert awake oriented x2 at baseline.  Was having her breakfast this morning.  No new complaints Blood pressure improving after resuming her home meds amlodipine and coreg. Afebrile.  Assessment & Plan:  Symptomatic bradycardia with syncope/sinus arrest: Appreciate EP cardiology input, status post PPM 9/24.  Patient is back on carvedilol for blood pressure control.  Dementia without behavioral disturbance: Continue supportive care.  Will resume her Namenda Aricept on d/c was held for bradycardia.  Hyperlipidemia: Continue statin/Zetia.  Diabetes mellitus, controlled hbA1c 6.8.  CBG fairly stable continue sliding scale.  Marland Kitchen Recent Labs  Lab 09/07/20 0608 09/07/20 1203 09/07/20 1717 09/07/20 2103  09/08/20 0607  GLUCAP 127* 179* 100* 233* 153*   Lab Results  Component Value Date   HGBA1C 6.8 (H) 09/04/2020   Essential hypertension: Borderline control, carvedilol and amlodipine held on admission has been resumed after discussion with Dr. Ladona Ridgel 9/25, also on hydralazine.  Continue to hold clonidine per cards.  Monitor and adjust  Hyperkalemia-resolved.  HypoKalemia was replaced and normalized.   Generalized debility: Continue to work PT OT.  Family digestive start skilled nursing facility and looking at the [facility] on Monday   GOC: Palliative care input appreciated.  She is now DNR.  DVT prophylaxis: SCDs Start: 09/06/20 1731 SCDs Start: 09/04/20 1952 Code Status:   Code Status: DNR  Family Communication: plan of care discussed with patient at bedside.  I discussed with patient's son and daughter in law over the phone 9/23, EPand palliative care has updated family.  Status is: Inpatient Remains inpatient appropriate because:Inpatient level of care appropriate due to severity of illness and Due to symptomatic bradycardia and syncope  Dispo: The patient is from: Home lives w/ family              Anticipated d/c is to: Short term SNF.              Anticipated d/c date MG:[id number].              Patient currently is medically stable for discharge  Nutrition: Diet Order            Diet Heart Room service appropriate? Yes; Fluid consistency: Thin  Diet effective now                  Body mass index  is 21.41 kg/m.  Consultants:see note  Procedures:see note Microbiology:see note Blood Culture    Component Value Date/Time   SDES URINE, RANDOM 10/25/2019 1602   SPECREQUEST  10/25/2019 1602    NONE Performed at [facility], [address], [place], [place] [address]    CULT MULTIPLE SPECIES PRESENT, SUGGEST RECOLLECTION (A) 10/25/2019 1602   REPTSTATUS 10/26/2019 FINAL 10/25/2019 1602    Other culture-see note  Medications: Scheduled Meds: .  amLODipine  10 mg Oral Daily  . carvedilol  6.25 mg Oral BID WC  . Chlorhexidine Gluconate Cloth  6 each Topical Q0600  . hydrALAZINE  25 mg Oral Q8H  . insulin aspart  0-5 Units Subcutaneous QHS  . insulin aspart  0-9 Units Subcutaneous TID WC  . mupirocin ointment  1 application Nasal BID  . sodium chloride flush  3 mL Intravenous Q12H   Continuous Infusions: . sodium chloride 50 mL/hr at 09/07/20 0900    Antimicrobials: Anti-infectives (From admission, onward)   Start     Dose/Rate Route Frequency Ordered Stop   09/07/20 0400  ceFAZolin (ANCEF) IVPB 1 g/50 mL premix        1 g 100 mL/hr over 30 Minutes Intravenous  Once 09/06/20 1730 09/07/20 0535   09/06/20 1130  gentamicin (GARAMYCIN) 80 mg in sodium chloride 0.9 % 500 mL irrigation        80 mg Irrigation To Cath Lab 09/06/20 1111 09/06/20 1635   09/06/20 1130  ceFAZolin (ANCEF) IVPB 2g/100 mL premix        2 g 200 mL/hr over 30 Minutes Intravenous To Cath Lab 09/06/20 1111 09/06/20 1643     Objective: Vitals: Today's Vitals   09/07/20 1938 09/07/20 2200 09/08/20 0327 09/08/20 0327  BP: (!) 183/84   (!) 177/77  Pulse: 72   72  Resp: 16   19  Temp: 98.1 F (36.7 C)   98.1 F (36.7 C)  TempSrc: Oral   Oral  SpO2: 97%   100%  Weight:   48.1 kg   Height:      PainSc:  0-No pain      Intake/Output Summary (Last 24 hours) at 09/08/2020 0723 Last data filed at 09/08/2020 4481 Gross per 24 hour  Intake 772.28 ml  Output 750 ml  Net 22.28 ml   Filed Weights   09/06/20 0501 09/07/20 0419 09/08/20 0327  Weight: 49.9 kg 45.4 kg 48.1 kg   Weight change: 2.722 kg  Intake/Output from previous day: 09/25 0701 - 09/26 0700 In: 772.3 [P.O.:600; I.V.:172.3] Out: 750 [Urine:750] Intake/Output this shift: No intake/output data recorded.  Examination: General exam: AAO x2 with baseline dementia, communicative, NAD, weak appearing. HEENT:Oral mucosa moist, Ear/Nose WNL grossly, dentition normal. Respiratory system:  bilaterally clear, pacemaker site with dressing intact,no wheezing or crackles,no use of accessory muscle Cardiovascular system: S1 & S2 +, No JVD,. Gastrointestinal system: Abdomen soft, NT,ND, BS+ Nervous System:Alert, awake, moving extremities and grossly nonfocal Extremities: No edema, distal peripheral pulses palpable.  Skin: No rashes,no icterus.  Chronic skin changes MSK: Normal muscle bulk,tone, power    Data Reviewed: I have personally reviewed following labs and imaging studies CBC: Recent Labs  Lab 09/04/20 1621 09/04/20 1629 09/05/20 0500  WBC 4.3  --  6.0  NEUTROABS 2.6  --   --   HGB 11.5* 11.2* 11.0*  HCT 36.2 33.0* 34.7*  MCV 97.3  --  99.7  PLT 276  --  226   Basic Metabolic Panel: Recent  Labs  Lab 09/04/20 1621 09/04/20 1629 09/05/20 0500 09/05/20 1309 09/05/20 2137 09/07/20 1039 09/08/20 0530  NA 139   < > 140 143 140 138 140  K 5.9*   < > 3.2* 2.8* 4.5 4.1 4.1  CL 107   < > 111 120* 109 108 109  CO2 22   < > 16* 14* 21* 19* 19*  GLUCOSE 177*   < > 155* 100* 163* 194* 163*  BUN 31*   < > 21 13 16 21 18   CREATININE 1.38*   < > 0.98 0.63 0.96 1.09* 0.95  CALCIUM 10.0   < > 8.0* 6.9* 9.7 9.6 9.5  MG 1.8  --   --   --   --   --   --    < > = values in this interval not displayed.   GFR: Estimated Creatinine Clearance: 26.8 mL/min (by C-G formula based on SCr of 0.95 mg/dL). Liver Function Tests: Recent Labs  Lab 09/04/20 1621 09/05/20 0500  AST 26 25  ALT 22 19  ALKPHOS 82 63  BILITOT 0.5 0.4  PROT 6.8 5.6*  ALBUMIN 3.8 3.1*   No results for input(s): LIPASE, AMYLASE in the last 168 hours. No results for input(s): AMMONIA in the last 168 hours. Coagulation Profile: No results for input(s): INR, PROTIME in the last 168 hours. Cardiac Enzymes: No results for input(s): CKTOTAL, CKMB, CKMBINDEX, TROPONINI in the last 168 hours. BNP (last 3 results) No results for input(s): PROBNP in the last 8760 hours. HbA1C: No results for input(s):  HGBA1C in the last 72 hours. CBG: Recent Labs  Lab 09/07/20 0608 09/07/20 1203 09/07/20 1717 09/07/20 2103 09/08/20 0607  GLUCAP 127* 179* 100* 233* 153*   Lipid Profile: No results for input(s): CHOL, HDL, LDLCALC, TRIG, CHOLHDL, LDLDIRECT in the last 72 hours. Thyroid Function Tests: No results for input(s): TSH, T4TOTAL, FREET4, T3FREE, THYROIDAB in the last 72 hours. Anemia Panel: No results for input(s): VITAMINB12, FOLATE, FERRITIN, TIBC, IRON, RETICCTPCT in the last 72 hours. Sepsis Labs: No results for input(s): PROCALCITON, LATICACIDVEN in the last 168 hours.  Recent Results (from the past 240 hour(s))  SARS Coronavirus 2 by RT PCR (hospital order, performed in [facility] hospital lab) Nasopharyngeal Nasopharyngeal Swab     Status: None   Collection Time: 09/04/20  6:29 PM   Specimen: Nasopharyngeal Swab  Result Value Ref Range Status   SARS Coronavirus 2 NEGATIVE NEGATIVE Final    Comment: (NOTE) SARS-CoV-2 target nucleic acids are NOT DETECTED.  The SARS-CoV-2 RNA is generally detectable in upper and lower respiratory specimens during the acute phase of infection. The lowest concentration of SARS-CoV-2 viral copies this assay can detect is 250 copies / mL. A negative result does not preclude SARS-CoV-2 infection and should not be used as the sole basis for treatment or other patient management decisions.  A negative result may occur with improper specimen collection / handling, submission of specimen other than nasopharyngeal swab, presence of viral mutation(s) within the areas targeted by this assay, and inadequate number of viral copies (<250 copies / mL). A negative result must be combined with clinical observations, patient history, and epidemiological information.  Fact Sheet for Patients:   09/06/20  Fact Sheet for Healthcare Providers: BoilerBrush.com.cy  This test is not yet approved or  cleared  by the https://pope.com/ FDA and has been authorized for detection and/or diagnosis of SARS-CoV-2 by FDA under an Emergency Use Authorization (EUA).  This EUA will remain  in effect (meaning this test can be used) for the duration of the COVID-19 declaration under Section 564(b)(1) of the Act, 21 U.S.C. section 360bbb-3(b)(1), unless the authorization is terminated or revoked sooner.  Performed at [facility], [address] [place], [place] [address]   Surgical PCR screen     Status: Abnormal   Collection Time: 09/06/20 12:39 PM   Specimen: Nasal Mucosa; Nasal Swab  Result Value Ref Range Status   MRSA, PCR NEGATIVE NEGATIVE Final   Staphylococcus aureus POSITIVE (A) NEGATIVE Final    Comment: (NOTE) The Xpert SA Assay (FDA approved for NASAL specimens in patients 84 years of age and older), is one component of a comprehensive surveillance program. It is not intended to diagnose infection nor to guide or monitor treatment. Performed at [facility], [address] [place], [place] [address]      Radiology Studies: EP PPM/ICD IMPLANT  Result Date: 09/06/2020 CONCLUSIONS:  1. Successful implantation of a St. Jude single-chamber pacemaker for symptomatic bradycardia due to sinus node dysfunction  2. No early apparent complications.       Lewayne BuntingGregg Taylor, MD 09/06/2020 4:51 PM   DG Chest Port 1 View  Result Date: 09/07/2020 CLINICAL DATA:  Pacemaker placement. EXAM: PORTABLE CHEST 1 VIEW COMPARISON:  09/04/2020 FINDINGS: The cardiomediastinal silhouette is unremarkable. A single lead LEFT-sided pacemaker is now noted. There is no evidence of pneumothorax. No pleural effusion. Minimal bibasilar atelectasis noted. IMPRESSION: LEFT pacemaker placement.  No pneumothorax. Electronically Signed   By: Harmon PierJeffrey  Hu M.D.   On: 09/07/2020 08:07     LOS: 4 days   Lanae Boastamesh Maycel Riffe, MD [facility]  09/08/2020, 7:23 AM

## 2020-09-08 NOTE — Evaluation (Addendum)
Physical Therapy Evaluation Patient Details Name: Angelica Lane MRN: [id number] DOB: [birth year] Today's Date: 09/08/2020   History of Present Illness  Pt is a 86 year old female with PMH of moderate dementia and falls, admitted 09/04/20 for syncope; found to have sinus node dysfunction with slowing and prolonged pauses. S/p pacemaker implant 9/24.    Clinical Impression  Pt presents with an overall decrease in functional mobility secondary to above. PTA, pt mod indep ambulating with and without RW, lives with son who works during the day. Educ on pacemaker precautions, therex, and importance of mobility. Today, pt able to initiate transfer and gait training, requiring consistent modA for stability; limited by generalized weakness, decreased activity tolerance and impaired balance. Pt would benefit from continued acute PT services to maximize functional mobility and independence prior to d/c with SNF-level therapies; pt and son in agreement.     Follow Up Recommendations SNF;Supervision/Assistance - 24 hour    Equipment Recommendations  None recommended by PT    Recommendations for Other Services       Precautions / Restrictions Precautions Precautions: ICD/Pacemaker;Fall Restrictions Weight Bearing Restrictions: Yes LUE Weight Bearing: Non weight bearing (sling placed)      Mobility  Bed Mobility Overal bed mobility: Needs Assistance Bed Mobility: Supine to Sit     Supine to sit: Mod assist;HOB elevated     General bed mobility comments: ModA to elevate trunk and scoot hips to EOB  Transfers Overall transfer level: Needs assistance Equipment used: Rolling walker (2 wheeled);1 person hand held assist Transfers: Sit to/from Stand Sit to Stand: Mod assist         General transfer comment: ModA to elevate trunk, posterior lean requiring cues for anterior weight translation; multiple trials with and without RW, consistent modA. Improved stability with HHA as RW too big for  pt  Ambulation/Gait Ambulation/Gait assistance: Mod assist Gait Distance (Feet): 8 Feet Assistive device: Rolling walker (2 wheeled);1 person hand held assist Gait Pattern/deviations: Step-to pattern;Trunk flexed;Leaning posteriorly Gait velocity: Decreased   General Gait Details: Steps to recliner, then forwards/backwards with RW and modA; frequent posterior lean requiring assist and cues to correct  Stairs            Wheelchair Mobility    Modified Rankin (Stroke Patients Only)       Balance Overall balance assessment: Needs assistance Sitting-balance support: Feet supported;Feet unsupported Sitting balance-Leahy Scale: Fair       Standing balance-Leahy Scale: Poor Standing balance comment: Reliant on UE support and external assist                             Pertinent Vitals/Pain Pain Assessment: No/denies pain    Home Living Family/patient expects to be discharged to:: Private residence Living Arrangements: Children Available Help at Discharge: Family;Available PRN/intermittently Type of Home: House Home Access: Level entry     Home Layout: Two level;Able to live on main level with bedroom/bathroom Home Equipment: Walker - 4 wheels;Shower seat;Grab bars - tub/shower;Bedside commode Additional Comments: Lives with son and daughter-in-law who work    Prior Function Level of Independence: Independent with assistive device(s)         Comments: Household ambulation without DME, ambulates outside with walker     Hand Dominance        Extremity/Trunk Assessment   Upper Extremity Assessment Upper Extremity Assessment: Generalized weakness;LUE deficits/detail LUE Deficits / Details: s/p pacemaker insertion with limited shoulder ROM per precautions; pt  with stiff wrist flex/ext and fingers do not extend fully, pt and son report baseline    Lower Extremity Assessment Lower Extremity Assessment: Generalized weakness    Cervical / Trunk  Assessment Cervical / Trunk Assessment: Kyphotic  Communication   Communication: HOH  Cognition Arousal/Alertness: Awake/alert Behavior During Therapy: Flat affect Overall Cognitive Status: History of cognitive impairments - at baseline Area of Impairment: Following commands;Problem solving;Awareness                       Following Commands: Follows one step commands with increased time;Follows multi-step commands with increased time   Awareness: Emergent Problem Solving: Requires verbal cues General Comments: H/o dementia      General Comments General comments (skin integrity, edema, etc.): Son present and end of session; pt and son agreeable to SNF    Exercises General Exercises - Upper Extremity Elbow Flexion: AROM;Left Elbow Extension: AROM;Left Wrist Flexion: AAROM;Left Wrist Extension: AAROM;Left Digit Composite Flexion: AROM;Left Composite Extension: AROM;Left   Assessment/Plan    PT Assessment Patient needs continued PT services  PT Problem List Decreased strength;Decreased range of motion;Decreased activity tolerance;Decreased balance;Decreased mobility;Decreased knowledge of use of DME;Decreased knowledge of precautions;Cardiopulmonary status limiting activity       PT Treatment Interventions DME instruction;Gait training;Stair training;Functional mobility training;Therapeutic activities;Therapeutic exercise;Balance training;Patient/family education    PT Goals (Current goals can be found in the Care Plan section)  Acute Rehab PT Goals Patient Stated Goal: Post-acute rehab at SNF before return home PT Goal Formulation: With patient/family Time For Goal Achievement: 09/22/20 Potential to Achieve Goals: Good    Frequency Min 2X/week   Barriers to discharge Decreased caregiver support      Co-evaluation               AM-PAC PT "6 Clicks" Mobility  Outcome Measure Help needed turning from your back to your side while in a flat bed without  using bedrails?: A Lot Help needed moving from lying on your back to sitting on the side of a flat bed without using bedrails?: A Lot Help needed moving to and from a bed to a chair (including a wheelchair)?: A Lot Help needed standing up from a chair using your arms (e.g., wheelchair or bedside chair)?: A Lot Help needed to walk in hospital room?: A Lot Help needed climbing 3-5 steps with a railing? : A Lot 6 Click Score: 12    End of Session Equipment Utilized During Treatment: Gait belt Activity Tolerance: Patient tolerated treatment well Patient left: in chair;with call bell/phone within reach;with chair alarm set;with family/visitor present Nurse Communication: Mobility status PT Visit Diagnosis: Other abnormalities of gait and mobility (R26.89);Muscle weakness (generalized) (M62.81)    Time: 0940-1001 PT Time Calculation (min) (ACUTE ONLY): 21 min   Charges:   PT Evaluation $PT Eval Moderate Complexity: 1 Mod         Ina Homes, PT, DPT Acute Rehabilitation Services  Pager [phone] Office [phone]  Malachy Chamber 09/08/2020, 11:40 AM

## 2020-09-09 ENCOUNTER — Encounter (HOSPITAL_COMMUNITY): Payer: Self-pay | Admitting: Internal Medicine

## 2020-09-09 LAB — GLUCOSE, CAPILLARY
Glucose-Capillary: 163 mg/dL — ABNORMAL HIGH (ref 70–99)
Glucose-Capillary: 220 mg/dL — ABNORMAL HIGH (ref 70–99)

## 2020-09-09 MED ORDER — LOSARTAN POTASSIUM 50 MG PO TABS
100.0000 mg | ORAL_TABLET | Freq: Every day | ORAL | Status: DC
  Administered 2020-09-09: 100 mg via ORAL
  Filled 2020-09-09: qty 2

## 2020-09-09 MED ORDER — OLANZAPINE 5 MG PO TABS
2.5000 mg | ORAL_TABLET | Freq: Every morning | ORAL | Status: DC
  Administered 2020-09-09: 2.5 mg via ORAL
  Filled 2020-09-09: qty 1

## 2020-09-09 MED ORDER — HYDRALAZINE HCL 50 MG PO TABS: 50.0000 mg | ORAL_TABLET | Freq: Three times a day (TID) | ORAL | Status: DC

## 2020-09-09 MED ORDER — DONEPEZIL HCL 5 MG PO TABS: 5.0000 mg | ORAL_TABLET | Freq: Every day | ORAL | Status: DC

## 2020-09-09 MED ORDER — AMLODIPINE BESYLATE 10 MG PO TABS: 10.0000 mg | ORAL_TABLET | Freq: Every day | ORAL | Status: AC

## 2020-09-09 MED ORDER — MEMANTINE HCL 10 MG PO TABS
5.0000 mg | ORAL_TABLET | Freq: Two times a day (BID) | ORAL | Status: DC
  Administered 2020-09-09: 5 mg via ORAL
  Filled 2020-09-09: qty 1

## 2020-09-09 MED ORDER — ESCITALOPRAM OXALATE 10 MG PO TABS
5.0000 mg | ORAL_TABLET | Freq: Every morning | ORAL | Status: DC
  Administered 2020-09-09: 5 mg via ORAL
  Filled 2020-09-09: qty 1

## 2020-09-09 MED ORDER — EZETIMIBE 10 MG PO TABS
10.0000 mg | ORAL_TABLET | Freq: Every day | ORAL | Status: DC
  Administered 2020-09-09: 10 mg via ORAL
  Filled 2020-09-09: qty 1

## 2020-09-09 MED FILL — Lidocaine HCl Local Inj 1%: INTRAMUSCULAR | Qty: 60 | Status: AC

## 2020-09-09 NOTE — Plan of Care (Signed)
  Problem: Education: Goal: Knowledge of General Education information will improve Description Including pain rating scale, medication(s)/side effects and non-pharmacologic comfort measures Outcome: Adequate for Discharge   Problem: Education: Goal: Knowledge of General Education information will improve Description Including pain rating scale, medication(s)/side effects and non-pharmacologic comfort measures Outcome: Adequate for Discharge   Problem: Health Behavior/Discharge Planning: Goal: Ability to manage health-related needs will improve Outcome: Adequate for Discharge   Problem: Clinical Measurements: Goal: Ability to maintain clinical measurements within normal limits will improve Outcome: Adequate for Discharge Goal: Will remain free from infection Outcome: Adequate for Discharge Goal: Diagnostic test results will improve Outcome: Adequate for Discharge Goal: Respiratory complications will improve Outcome: Adequate for Discharge Goal: Cardiovascular complication will be avoided Outcome: Adequate for Discharge   Problem: Activity: Goal: Risk for activity intolerance will decrease Outcome: Adequate for Discharge   Problem: Nutrition: Goal: Adequate nutrition will be maintained Outcome: Adequate for Discharge   Problem: Coping: Goal: Level of anxiety will decrease Outcome: Adequate for Discharge   Problem: Elimination: Goal: Will not experience complications related to bowel motility Outcome: Adequate for Discharge Goal: Will not experience complications related to urinary retention Outcome: Adequate for Discharge   Problem: Pain Managment: Goal: General experience of comfort will improve Outcome: Adequate for Discharge   Problem: Safety: Goal: Ability to remain free from injury will improve Outcome: Adequate for Discharge   Problem: Skin Integrity: Goal: Risk for impaired skin integrity will decrease Outcome: Adequate for Discharge   

## 2020-09-09 NOTE — Progress Notes (Signed)
Community education officer received referral for pt to participate in [facility] palliative program once pt discharges. Called pt's son to confirm interest with no answer.   Liaison to follow while in hospital to alert [facility] palliative team who will then outreach family to arrange Palliative admission visit.     Please do not hesitate to call with any questions and thank you for the referral.     Trena Platt, RN  [facility] Liaison   [phone]

## 2020-09-09 NOTE — Progress Notes (Signed)
Palliative Medicine RN Note: Contacted Civil engineer, contracting, aka [facility] ([facility] to let them know of OP Palliative referral. Angelica Lane is aware.  [name], RN, BSN, [facility] Palliative Medicine Team 09/09/2020 10:07 AM Office [phone]

## 2020-09-09 NOTE — Care Management Important Message (Signed)
Important Message  Patient Details  Name: Angelica Lane MRN: [id number] Date of Birth: Apr 12, 1928   Medicare Important Message Given:  Yes     Katrice Goel Stefan Church 09/09/2020, 3:42 PM

## 2020-09-09 NOTE — TOC Transition Note (Signed)
Transition of Care [facility]) - CM/SW Discharge Note   Patient Details  Name: Angelica Lane MRN: [id number] Date of Birth: 1928/12/11  Transition of Care [facility]) CM/SW Contact:  Carmina Miller, LCSWA Phone Number: 09/09/2020, 12:00 PM   Clinical Narrative:    Patient will DC to: [facility] Anticipated DC date:  09/09/20 Family notified: Advertising account planner Transport by:  [name] Mons   Per MD patient ready for DC to [facility]. RN to call report prior to discharge [phone] room 207P. RN, patient, patient's family, and facility notified of DC. Discharge Summary and FL2 sent to facility. DC packet on chart. Ambulance transport requested for patient.   CSW will sign off for now as social work intervention is no longer needed. Please consult [place] again if new needs arise.     Final next level of care: Skilled Nursing Facility Barriers to Discharge: Barriers Resolved   Patient Goals and CMS Choice Patient states their goals for this hospitalization and ongoing recovery are:: Get better soon CMS Medicare.gov Compare Post Acute Care list provided to:: Patient Represenative (must comment) (Bridgette) Choice offered to / list presented to : Adult Children  Discharge Placement PASRR number recieved: 09/06/20            Patient chooses bed at: [facility] Patient to be transferred to facility by: [facility] Name of family member notified: Isaias Sakai Patient and family notified of of transfer: 09/09/20  Discharge Plan and Services     Post Acute Care Choice: Skilled Nursing Facility                      [facility] Agency: NA        Social Determinants of Health (SDOH) Interventions     Readmission Risk Interventions No flowsheet data found.

## 2020-09-09 NOTE — Progress Notes (Signed)
D/C instructions printed and placed in packet at nurse's station for PTAR. Tele and IV removed, tolerated well. 

## 2020-09-09 NOTE — Discharge Summary (Signed)
Physician Discharge Summary  Clarabel Marion NKN:[id number] DOB: [birth year] DOA: 09/04/2020  PCP: Harvest Forest, MD  Admit date: 09/04/2020 Discharge date: 09/09/2020  Admitted From: home Disposition:  SNF  Recommendations for Outpatient Follow-up:  1. Follow up with PCP in 1-2 weeks 2. Please obtain BMP/CBC in one week 3. Please follow up on the following pending results:  Home Health:nO  Equipment/Devices: nONE  Discharge Condition: Stable Code Status:   Code Status: DNR Diet recommendation:  Diet Order            Diet - low sodium heart healthy           Diet Heart Room service appropriate? Yes; Fluid consistency: Thin  Diet effective now                  Brief/Interim Summary: 86 y.o.femalewith medical history significant ofhypertension, hyperlipidemia, diabetes, dementia, generalized debility and depression admitted secondary to syncopal episode versus uncontrolled, felt unsteady and dizzy.  History of fall in the past thought to be secondary to orthostasis.  She is on carvedilol and clonidine for hypertension. She had fall about 2 weeks ago with facial trauma and was seen at [facility].  ED Course:Temperature is 97.8 blood pressure 116/53 pulse 48 respirate 21 oxygen sat 98% room air. Potassium is 6.4 hemoglobin 11.2 otherwise chemistry also within normal. She has a BUN of 29 creatinine 1.1 calcium 10.0 glucose 153. Hemoglobin A1c of 6.8 troponin of 8. TSH 1.112. Chest x-ray showed no acute findings. Patient being admitted with symptomatic bradycardia. Seen by EP-her carvedilol Namenda and Aricept has been discontinued and allowing for washout for beta-blocker. She is being monitored on telemetry 9/24- s/p PPM, doing well post PPM Amlodipine carvedilol resumed with improving blood pressure. family desiring SNF TOC has arranged skilled nursing facility  Discharge Diagnoses:  Symptomatic bradycardia with syncope/sinus arrest:  Seen by EP, status post PPM 9/24.   Doing well.  Back on carvedilol for blood pressure control.    Dementia without behavioral disturbance: Continue supportive care.   Resume her Namenda Aricept on d/c - there were held for bradycardia.  Hyperlipidemia: Continue statin/Zetia.  Diabetes mellitus, controlled hbA1c 6.8.   Blood sugar is well controlled.  Essential hypertension: BP fairly controlled, resume losartan, cont coreg, amlodipine. Cardiology Dr Lowella Curb advised to hold off on clonidine. Fu with pcp , monitor Bp to adjust meds.  Hyperkalemia-resolved.  HypoKalemia was replaced and normalized.   Generalized debility: Continue to work PT OT.  Family digestive start skilled nursing facility and looking at the [facility] on Monday   GOC: Palliative care input appreciated.  She is now DNR.  Consults:  EP  CARDIO  Subjective: Alert,awake oriented x2, baseline, eating her meal, no chest pain.PM site dressing intact  Discharge Exam: Vitals:   09/08/20 2156 09/09/20 0641  BP: (!) 175/67 (!) 175/76  Pulse: 73 72  Resp: 18 20  Temp: 97.9 F (36.6 C) 98 F (36.7 C)  SpO2: 100% 100%   General: Pt is alert, awake, not in acute distress Cardiovascular: RRR, S1/S2 +, no rubs, no gallops Respiratory: CTA bilaterally, no wheezing, no rhonchi Abdominal: Soft, NT, ND, bowel sounds + Extremities: no edema, no cyanosis  Discharge Instructions  Discharge Instructions    Diet - low sodium heart healthy   Complete by: As directed    Discharge instructions   Complete by: As directed    Please call call MD or return to ER for similar or worsening recurring problem that brought  you to hospital or if any fever,nausea/vomiting,abdominal pain, uncontrolled pain, chest pain,  shortness of breath or any other alarming symptoms.  Please follow-up your doctor as instructed in a week time and call the office for appointment.  Please avoid alcohol, smoking, or any other illicit substance and maintain healthy habits  including taking your regular medications as prescribed.  You were cared for by a hospitalist during your hospital stay. If you have any questions about your discharge medications or the care you received while you were in the hospital after you are discharged, you can call the unit and ask to speak with the hospitalist on call if the hospitalist that took care of you is not available.  Once you are discharged, your primary care physician will handle any further medical issues. Please note that NO REFILLS for any discharge medications will be authorized once you are discharged, as it is imperative that you return to your primary care physician (or establish a relationship with a primary care physician if you do not have one) for your aftercare needs so that they can reassess your need for medications and monitor your lab values   Discharge wound care:   Complete by: As directed    Wound care per PM d/c instruction   Increase activity slowly   Complete by: As directed      Allergies as of 09/09/2020   No Known Allergies     Medication List    STOP taking these medications   cloNIDine 0.1 MG tablet Commonly known as: CATAPRES     TAKE these medications   amLODipine 10 MG tablet Commonly known as: NORVASC Take 1 tablet (10 mg total) by mouth daily. What changed:   medication strength  how much to take   ammonium lactate 12 % lotion Commonly known as: LAC-HYDRIN Apply 1 application topically every other day.   CALCIUM 500/D PO Take 500 mg by mouth daily.   carvedilol 6.25 MG tablet Commonly known as: COREG Take 6.25 mg by mouth 2 (two) times daily with a meal.   donepezil 5 MG tablet Commonly known as: ARICEPT Take 5 mg by mouth at bedtime.   escitalopram 5 MG tablet Commonly known as: LEXAPRO Take 5 mg by mouth every morning.   ezetimibe 10 MG tablet Commonly known as: ZETIA Take 10 mg by mouth daily.   ketoconazole 2 % cream Commonly known as: NIZORAL Apply 1  application topically every other day.   losartan 100 MG tablet Commonly known as: COZAAR Take 100 mg by mouth daily.   memantine 5 MG tablet Commonly known as: NAMENDA Take 5 mg by mouth 2 (two) times daily.   metFORMIN 500 MG 24 hr tablet Commonly known as: GLUCOPHAGE-XR Take 500 mg by mouth 2 (two) times daily.   OLANZapine 2.5 MG tablet Commonly known as: ZYPREXA Take 2.5 mg by mouth every morning.   ONE-A-DAY 50 PLUS PO Take 1 tablet by mouth daily.   simvastatin 10 MG tablet Commonly known as: ZOCOR Take 10 mg by mouth every evening.   triamcinolone ointment 0.1 % Commonly known as: KENALOG Apply 1 application topically 3 (three) times a week.            Discharge Care Instructions  (From admission, onward)         Start     Ordered   09/09/20 0000  Discharge wound care:       Comments: Wound care per PM d/c instruction   09/09/20 0933  Follow-up Information    Marinus Maw, MD Follow up on 12/17/2020.   Specialty: Cardiology Why: at 330 for 3 month pacemaker check Contact information: [address] [place] [address] [phone]        [facility] Follow up on 09/19/2020.   Why: at 230 pm for pacemaker wound check. If pt goes to care facility out of town, this can be arranged to be a virtual visit.  Contact information: [address] [place] [address] [phone]             No Known Allergies  The results of significant diagnostics from this hospitalization (including imaging, microbiology, ancillary and laboratory) are listed below for reference.    Microbiology: Recent Results (from the past 240 hour(s))  SARS Coronavirus 2 by RT PCR (hospital order, performed in [facility] hospital lab) Nasopharyngeal Nasopharyngeal Swab     Status: None   Collection Time: 09/04/20  6:29 PM   Specimen: Nasopharyngeal Swab  Result Value  Ref Range Status   SARS Coronavirus 2 NEGATIVE NEGATIVE Final    Comment: (NOTE) SARS-CoV-2 target nucleic acids are NOT DETECTED.  The SARS-CoV-2 RNA is generally detectable in upper and lower respiratory specimens during the acute phase of infection. The lowest concentration of SARS-CoV-2 viral copies this assay can detect is 250 copies / mL. A negative result does not preclude SARS-CoV-2 infection and should not be used as the sole basis for treatment or other patient management decisions.  A negative result may occur with improper specimen collection / handling, submission of specimen other than nasopharyngeal swab, presence of viral mutation(s) within the areas targeted by this assay, and inadequate number of viral copies (<250 copies / mL). A negative result must be combined with clinical observations, patient history, and epidemiological information.  Fact Sheet for Patients:   BoilerBrush.com.cy  Fact Sheet for Healthcare Providers: https://pope.com/  This test is not yet approved or  cleared by the [place] FDA and has been authorized for detection and/or diagnosis of SARS-CoV-2 by FDA under an Emergency Use Authorization (EUA).  This EUA will remain in effect (meaning this test can be used) for the duration of the COVID-19 declaration under Section 564(b)(1) of the Act, 21 U.S.C. section 360bbb-3(b)(1), unless the authorization is terminated or revoked sooner.  Performed at [facility], [address], [place], [place] [address]   Surgical PCR screen     Status: Abnormal   Collection Time: 09/06/20 12:39 PM   Specimen: Nasal Mucosa; Nasal Swab  Result Value Ref Range Status   MRSA, PCR NEGATIVE NEGATIVE Final   Staphylococcus aureus POSITIVE (A) NEGATIVE Final    Comment: (NOTE) The Xpert SA Assay (FDA approved for NASAL specimens in patients 57 years of age and older), is one component of a  comprehensive surveillance program. It is not intended to diagnose infection nor to guide or monitor treatment. Performed at [facility], [address], [place], [place] [address]   SARS CORONAVIRUS 2 (TAT 6-24 HRS) Nasopharyngeal Nasopharyngeal Swab     Status: None   Collection Time: 09/08/20 10:52 AM   Specimen: Nasopharyngeal Swab  Result Value Ref Range Status   SARS Coronavirus 2 NEGATIVE NEGATIVE Final    Comment: (NOTE) SARS-CoV-2 target nucleic acids are NOT DETECTED.  The SARS-CoV-2 RNA is generally detectable in upper and lower respiratory specimens during the acute phase of infection. Negative results do not preclude  SARS-CoV-2 infection, do not rule out co-infections with other pathogens, and should not be used as the sole basis for treatment or other patient management decisions. Negative results must be combined with clinical observations, patient history, and epidemiological information. The expected result is Negative.  Fact Sheet for Patients: HairSlick.no  Fact Sheet for Healthcare Providers: quierodirigir.com  This test is not yet approved or cleared by the [place] FDA and  has been authorized for detection and/or diagnosis of SARS-CoV-2 by FDA under an Emergency Use Authorization (EUA). This EUA will remain  in effect (meaning this test can be used) for the duration of the COVID-19 declaration under Se ction 564(b)(1) of the Act, 21 U.S.C. section 360bbb-3(b)(1), unless the authorization is terminated or revoked sooner.  Performed at [facility], [address] [place], [place] [address]     Procedures/Studies: CT HEAD WO CONTRAST  Result Date: 08/21/2020 CLINICAL DATA:  Larey Seat this morning, facial trauma EXAM: CT HEAD WITHOUT CONTRAST CT CERVICAL SPINE WITHOUT CONTRAST TECHNIQUE: Multidetector CT imaging of the head and cervical spine was performed following the standard protocol  without intravenous contrast. Multiplanar CT image reconstructions of the cervical spine were also generated. COMPARISON:  03/23/2018 FINDINGS: CT HEAD FINDINGS Brain: No acute infarct or hemorrhage. Lateral ventricles and midline structures are unremarkable. No acute extra-axial fluid collections. Diffuse cerebral atrophy unchanged. No mass effect. Vascular: No hyperdense vessel or unexpected calcification. Skull: Normal. Negative for fracture or focal lesion. Sinuses/Orbits: No acute finding. Other: None. CT CERVICAL SPINE FINDINGS Alignment: There is straightening of the cervical spine, likely due to extensive multilevel spondylosis and facet hypertrophy. Otherwise alignment is anatomic. Skull base and vertebrae: No acute displaced fracture. Soft tissues and spinal canal: No prevertebral fluid or swelling. No visible canal hematoma. Disc levels: There is diffuse cervical facet hypertrophy, with bony fusion across the left C2/C3 facet joints. There is multilevel spondylosis most pronounced at C4-5, C5-6, and C6-7. No significant central canal or neural foraminal encroachment. Upper chest: Airway is patent.  Right apical scarring. Other: Reconstructed images demonstrate no additional findings. IMPRESSION: 1. No acute intracranial process. 2. No acute cervical spine fracture. Extensive multilevel spondylosis and facet hypertrophy. Electronically Signed   By: Sharlet Salina M.D.   On: 08/21/2020 17:05   CT Cervical Spine Wo Contrast  Result Date: 08/21/2020 CLINICAL DATA:  Larey Seat this morning, facial trauma EXAM: CT HEAD WITHOUT CONTRAST CT CERVICAL SPINE WITHOUT CONTRAST TECHNIQUE: Multidetector CT imaging of the head and cervical spine was performed following the standard protocol without intravenous contrast. Multiplanar CT image reconstructions of the cervical spine were also generated. COMPARISON:  03/23/2018 FINDINGS: CT HEAD FINDINGS Brain: No acute infarct or hemorrhage. Lateral ventricles and midline  structures are unremarkable. No acute extra-axial fluid collections. Diffuse cerebral atrophy unchanged. No mass effect. Vascular: No hyperdense vessel or unexpected calcification. Skull: Normal. Negative for fracture or focal lesion. Sinuses/Orbits: No acute finding. Other: None. CT CERVICAL SPINE FINDINGS Alignment: There is straightening of the cervical spine, likely due to extensive multilevel spondylosis and facet hypertrophy. Otherwise alignment is anatomic. Skull base and vertebrae: No acute displaced fracture. Soft tissues and spinal canal: No prevertebral fluid or swelling. No visible canal hematoma. Disc levels: There is diffuse cervical facet hypertrophy, with bony fusion across the left C2/C3 facet joints. There is multilevel spondylosis most pronounced at C4-5, C5-6, and C6-7. No significant central canal or neural foraminal encroachment. Upper chest: Airway is patent.  Right apical scarring. Other: Reconstructed images demonstrate no additional  findings. IMPRESSION: 1. No acute intracranial process. 2. No acute cervical spine fracture. Extensive multilevel spondylosis and facet hypertrophy. Electronically Signed   By: Sharlet Salina M.D.   On: 08/21/2020 17:05   EP PPM/ICD IMPLANT  Result Date: 09/06/2020 CONCLUSIONS:  1. Successful implantation of a [employer] single-chamber pacemaker for symptomatic bradycardia due to sinus node dysfunction  2. No early apparent complications.       Lewayne Bunting, MD 09/06/2020 4:51 PM   DG Chest Port 1 View  Result Date: 09/07/2020 CLINICAL DATA:  Pacemaker placement. EXAM: PORTABLE CHEST 1 VIEW COMPARISON:  09/04/2020 FINDINGS: The cardiomediastinal silhouette is unremarkable. A single lead LEFT-sided pacemaker is now noted. There is no evidence of pneumothorax. No pleural effusion. Minimal bibasilar atelectasis noted. IMPRESSION: LEFT pacemaker placement.  No pneumothorax. Electronically Signed   By: Harmon Pier M.D.   On: 09/07/2020 08:07   DG Chest  Portable 1 View  Result Date: 09/04/2020 CLINICAL DATA:  Syncope EXAM: PORTABLE CHEST 1 VIEW COMPARISON:  03/23/2018 FINDINGS: Single frontal view of the chest demonstrates a stable cardiac silhouette. Stable atherosclerosis of the aortic arch. No airspace disease, effusion, or pneumothorax. No acute bony abnormalities. IMPRESSION: 1. No acute intrathoracic process. Electronically Signed   By: Sharlet Salina M.D.   On: 09/04/2020 16:39   DG Knee Complete 4 Views Right  Result Date: 08/21/2020 CLINICAL DATA:  Right knee pain after fall today. EXAM: RIGHT KNEE - COMPLETE 4+ VIEW COMPARISON:  None. FINDINGS: No evidence of fracture, dislocation, or joint effusion. Severe narrowing of medial joint space is noted. Moderate narrowing of patellofemoral space is noted with osteophyte formation. Chondrocalcinosis is noted in lateral joint space. Soft tissues are unremarkable. IMPRESSION: Severe degenerative joint disease. No acute abnormality seen in the right knee. Electronically Signed   By: Lupita Raider M.D.   On: 08/21/2020 16:28   ECHOCARDIOGRAM COMPLETE  Result Date: 09/05/2020    ECHOCARDIOGRAM REPORT   Patient Name:   ANTONIETTA LANSDOWNE Date of Exam: 09/05/2020 Medical Rec #:  [id number]     Height:       59.0 in Accession #:    [id number]    Weight:       109.0 lb Date of Birth:  1928/01/18      BSA:          1.425 m Patient Age:    89 years      BP:           156/57 mmHg Patient Gender: F             HR:           58 bpm. Exam Location:  Inpatient Procedure: 2D Echo, Cardiac Doppler and Color Doppler Indications:    Syncope 780.2 / R55  History:        Patient has no prior history of Echocardiogram examinations.                 Risk Factors:Hypertension, Diabetes, Dyslipidemia and                 Non-Smoker.  Sonographer:    Renella Cunas RDCS Referring Phys: [id number] MOHAMMAD L GARBA IMPRESSIONS  1. Left ventricular ejection fraction, by estimation, is 60 to 65%. The left ventricle has normal function. The left  ventricle has no regional wall motion abnormalities. Left ventricular diastolic parameters are consistent with Grade I diastolic dysfunction (impaired relaxation).  2. Right ventricular systolic function is normal. The right ventricular size is  normal.  3. The mitral valve is grossly normal. No evidence of mitral valve regurgitation. No evidence of mitral stenosis.  4. The aortic valve is calcified. Aortic valve regurgitation is not visualized. No aortic stenosis is present. FINDINGS  Left Ventricle: Left ventricular ejection fraction, by estimation, is 60 to 65%. The left ventricle has normal function. The left ventricle has no regional wall motion abnormalities. The left ventricular internal cavity size was normal in size. There is  no left ventricular hypertrophy. Left ventricular diastolic parameters are consistent with Grade I diastolic dysfunction (impaired relaxation). Right Ventricle: The right ventricular size is normal. No increase in right ventricular wall thickness. Right ventricular systolic function is normal. Left Atrium: Left atrial size was normal in size. Right Atrium: Right atrial size was normal in size. Pericardium: There is no evidence of pericardial effusion. Mitral Valve: The mitral valve is grossly normal. No evidence of mitral valve regurgitation. No evidence of mitral valve stenosis. Tricuspid Valve: The tricuspid valve is grossly normal. Tricuspid valve regurgitation is not demonstrated. Aortic Valve: The aortic valve is calcified. Aortic valve regurgitation is not visualized. No aortic stenosis is present. Pulmonic Valve: The pulmonic valve was normal in structure. Pulmonic valve regurgitation is not visualized. Aorta: The aortic root and ascending aorta are structurally normal, with no evidence of dilitation. IAS/Shunts: The atrial septum is grossly normal.  LEFT VENTRICLE PLAX 2D LVIDd:         4.40 cm     Diastology LVIDs:         3.20 cm     LV e' medial:    5.22 cm/s LV PW:          0.70 cm     LV E/e' medial:  14.9 LV IVS:        0.70 cm     LV e' lateral:   5.77 cm/s LVOT diam:     1.90 cm     LV E/e' lateral: 13.4 LV SV:         74 LV SV Index:   52 LVOT Area:     2.84 cm  LV Volumes (MOD) LV vol d, MOD A2C: 69.8 ml LV vol d, MOD A4C: 67.9 ml LV vol s, MOD A2C: 23.2 ml LV vol s, MOD A4C: 29.0 ml LV SV MOD A2C:     46.6 ml LV SV MOD A4C:     67.9 ml LV SV MOD BP:      42.6 ml RIGHT VENTRICLE RV S prime:     9.90 cm/s TAPSE (M-mode): 1.8 cm LEFT ATRIUM             Index       RIGHT ATRIUM          Index LA diam:        3.70 cm 2.60 cm/m  RA Area:     7.77 cm LA Vol (A2C):   22.1 ml 15.51 ml/m RA Volume:   12.10 ml 8.49 ml/m LA Vol (A4C):   24.3 ml 17.05 ml/m LA Biplane Vol: 25.0 ml 17.54 ml/m  AORTIC VALVE LVOT Vmax:   113.00 cm/s LVOT Vmean:  79.800 cm/s LVOT VTI:    0.262 m  AORTA Ao Root diam: 2.70 cm MITRAL VALVE MV Area (PHT): 2.62 cm     SHUNTS MV Decel Time: 289 msec     Systemic VTI:  0.26 m MV E velocity: 77.60 cm/s   Systemic Diam: 1.90 cm MV A velocity: 110.00 cm/s MV E/A  ratio:  0.71 Kristeen MissPhilip Nahser MD Electronically signed by Kristeen MissPhilip Nahser MD Signature Date/Time: 09/05/2020/12:29:07 PM    Final     Labs: BNP (last 3 results) No results for input(s): BNP in the last 8760 hours. Basic Metabolic Panel: Recent Labs  Lab 09/04/20 1621 09/04/20 1629 09/05/20 0500 09/05/20 1309 09/05/20 2137 09/07/20 1039 09/08/20 0530  NA 139   < > 140 143 140 138 140  K 5.9*   < > 3.2* 2.8* 4.5 4.1 4.1  CL 107   < > 111 120* 109 108 109  CO2 22   < > 16* 14* 21* 19* 19*  GLUCOSE 177*   < > 155* 100* 163* 194* 163*  BUN 31*   < > 21 13 16 21 18   CREATININE 1.38*   < > 0.98 0.63 0.96 1.09* 0.95  CALCIUM 10.0   < > 8.0* 6.9* 9.7 9.6 9.5  MG 1.8  --   --   --   --   --   --    < > = values in this interval not displayed.   Liver Function Tests: Recent Labs  Lab 09/04/20 1621 09/05/20 0500  AST 26 25  ALT 22 19  ALKPHOS 82 63  BILITOT 0.5 0.4  PROT 6.8 5.6*  ALBUMIN  3.8 3.1*   No results for input(s): LIPASE, AMYLASE in the last 168 hours. No results for input(s): AMMONIA in the last 168 hours. CBC: Recent Labs  Lab 09/04/20 1621 09/04/20 1629 09/05/20 0500  WBC 4.3  --  6.0  NEUTROABS 2.6  --   --   HGB 11.5* 11.2* 11.0*  HCT 36.2 33.0* 34.7*  MCV 97.3  --  99.7  PLT 276  --  226   Cardiac Enzymes: No results for input(s): CKTOTAL, CKMB, CKMBINDEX, TROPONINI in the last 168 hours. BNP: Invalid input(s): POCBNP CBG: Recent Labs  Lab 09/08/20 0607 09/08/20 1223 09/08/20 1619 09/08/20 2140 09/09/20 0622  GLUCAP 153* 212* 178* 235* 163*   D-Dimer No results for input(s): DDIMER in the last 72 hours. Hgb A1c No results for input(s): HGBA1C in the last 72 hours. Lipid Profile No results for input(s): CHOL, HDL, LDLCALC, TRIG, CHOLHDL, LDLDIRECT in the last 72 hours. Thyroid function studies No results for input(s): TSH, T4TOTAL, T3FREE, THYROIDAB in the last 72 hours.  Invalid input(s): FREET3 Anemia work up No results for input(s): VITAMINB12, FOLATE, FERRITIN, TIBC, IRON, RETICCTPCT in the last 72 hours. Urinalysis    Component Value Date/Time   LABSPEC 1.015 10/25/2019 1559   PHURINE 6.0 10/25/2019 1559   GLUCOSEU NEGATIVE 10/25/2019 1559   HGBUR NEGATIVE 10/25/2019 1559   BILIRUBINUR NEGATIVE 10/25/2019 1559   KETONESUR TRACE (A) 10/25/2019 1559   PROTEINUR 30 (A) 10/25/2019 1559   UROBILINOGEN 0.2 10/25/2019 1559   NITRITE NEGATIVE 10/25/2019 1559   LEUKOCYTESUR SMALL (A) 10/25/2019 1559   Sepsis Labs Invalid input(s): PROCALCITONIN,  WBC,  LACTICIDVEN Microbiology Recent Results (from the past 240 hour(s))  SARS Coronavirus 2 by RT PCR (hospital order, performed [facility] lab) Nasopharyngeal Nasopharyngeal Swab     Status: None   Collection Time: 09/04/20  6:29 PM   Specimen: Nasopharyngeal Swab  Result Value Ref Range Status   SARS Coronavirus 2 NEGATIVE NEGATIVE Final    Comment:  (NOTE) SARS-CoV-2 target nucleic acids are NOT DETECTED.  The SARS-CoV-2 RNA is generally detectable in upper and lower respiratory specimens during the acute phase of infection. The lowest concentration of SARS-CoV-2  viral copies this assay can detect is 250 copies / mL. A negative result does not preclude SARS-CoV-2 infection and should not be used as the sole basis for treatment or other patient management decisions.  A negative result may occur with improper specimen collection / handling, submission of specimen other than nasopharyngeal swab, presence of viral mutation(s) within the areas targeted by this assay, and inadequate number of viral copies (<250 copies / mL). A negative result must be combined with clinical observations, patient history, and epidemiological information.  Fact Sheet for Patients:   BoilerBrush.com.cy  Fact Sheet for Healthcare Providers: https://pope.com/  This test is not yet approved or  cleared by the [place] FDA and has been authorized for detection and/or diagnosis of SARS-CoV-2 by FDA under an Emergency Use Authorization (EUA).  This EUA will remain in effect (meaning this test can be used) for the duration of the COVID-19 declaration under Section 564(b)(1) of the Act, 21 U.S.C. section 360bbb-3(b)(1), unless the authorization is terminated or revoked sooner.  Performed at [facility], [address], [place], [place] [address]   Surgical PCR screen     Status: Abnormal   Collection Time: 09/06/20 12:39 PM   Specimen: Nasal Mucosa; Nasal Swab  Result Value Ref Range Status   MRSA, PCR NEGATIVE NEGATIVE Final   Staphylococcus aureus POSITIVE (A) NEGATIVE Final    Comment: (NOTE) The Xpert SA Assay (FDA approved for NASAL specimens in patients 66 years of age and older), is one component of a comprehensive surveillance program. It is not intended to diagnose infection nor to guide  or monitor treatment. Performed at [facility], [address], [place], [place] [address]   SARS CORONAVIRUS 2 (TAT 6-24 HRS) Nasopharyngeal Nasopharyngeal Swab     Status: None   Collection Time: 09/08/20 10:52 AM   Specimen: Nasopharyngeal Swab  Result Value Ref Range Status   SARS Coronavirus 2 NEGATIVE NEGATIVE Final    Comment: (NOTE) SARS-CoV-2 target nucleic acids are NOT DETECTED.  The SARS-CoV-2 RNA is generally detectable in upper and lower respiratory specimens during the acute phase of infection. Negative results do not preclude SARS-CoV-2 infection, do not rule out co-infections with other pathogens, and should not be used as the sole basis for treatment or other patient management decisions. Negative results must be combined with clinical observations, patient history, and epidemiological information. The expected result is Negative.  Fact Sheet for Patients: HairSlick.no  Fact Sheet for Healthcare Providers: quierodirigir.com  This test is not yet approved or cleared by the [place] FDA and  has been authorized for detection and/or diagnosis of SARS-CoV-2 by FDA under an Emergency Use Authorization (EUA). This EUA will remain  in effect (meaning this test can be used) for the duration of the COVID-19 declaration under Se ction 564(b)(1) of the Act, 21 U.S.C. section 360bbb-3(b)(1), unless the authorization is terminated or revoked sooner.  Performed at [facility], [address] [place], [place] [address]      Time coordinating discharge: 25  minutes  SIGNED: Lanae Boast, MD  [facility] 09/09/2020, 9:34 AM  If 7PM-7AM, please contact night-coverage [email or web address]

## 2020-09-19 ENCOUNTER — Other Ambulatory Visit: Payer: Self-pay

## 2020-09-19 ENCOUNTER — Ambulatory Visit (INDEPENDENT_AMBULATORY_CARE_PROVIDER_SITE_OTHER): Payer: Medicare Other | Admitting: Emergency Medicine

## 2020-09-19 DIAGNOSIS — R001 Bradycardia, unspecified: Secondary | ICD-10-CM

## 2020-09-19 LAB — CUP PACEART INCLINIC DEVICE CHECK
Battery Remaining Longevity: 150 mo
Battery Voltage: 3.11 V
Brady Statistic RV Percent Paced: 0.06 %
Date Time Interrogation Session: 20211007224307
Implantable Lead Implant Date: 20210924
Implantable Lead Location: 753860
Implantable Pulse Generator Implant Date: 20210924
Lead Channel Impedance Value: 550 Ohm
Lead Channel Pacing Threshold Amplitude: 0.5 V
Lead Channel Pacing Threshold Amplitude: 0.5 V
Lead Channel Pacing Threshold Pulse Width: 0.4 ms
Lead Channel Pacing Threshold Pulse Width: 0.4 ms
Lead Channel Sensing Intrinsic Amplitude: 12 mV
Lead Channel Setting Pacing Amplitude: 3.5 V
Lead Channel Setting Pacing Pulse Width: 0.4 ms
Lead Channel Setting Sensing Sensitivity: 2 mV
Pulse Gen Model: 1272
Pulse Gen Serial Number: 3828073

## 2020-09-19 NOTE — Progress Notes (Signed)
Wound check appointment. Steri-strips removed. Wound without redness or edema. Incision edges approximated, wound well healed. Normal device function. Thresholds, sensing, and impedances consistent with implant measurements. Device programmed at 3.5V for extra safety margin until 3 month visit. Histogram distribution appropriate for patient and level of activity. No high ventricular rates noted. Patient educated about wound care, arm mobility, lifting restrictions. Patient educated on remote monitoring, follow-up reuired with family who was not present as to determine location of monitor.  ROV with Dr.Taylor 12/17/20.

## 2020-09-23 ENCOUNTER — Telehealth: Payer: Self-pay

## 2020-09-23 NOTE — Telephone Encounter (Signed)
Angelica Lane (PT) from [facility] called wanting to know how long until patient can bare weight to use walker. Advised 6 weeks from PPM inplant date. Verbalized understanding. Advised if he she has further questions or concerns to please call back. Agreeable to plan.

## 2020-09-23 NOTE — Telephone Encounter (Signed)
LVM for pt son, SJM remote monitor is showing as not communicating, it does not appear to have been plugged in since pt was d/c from implant.  At wound check visit, pt indicats she thinks her son has the monitor.  Attempted to call him when pt was still here.  No answer.  Attempted to call him again today, no answer, left message requesting he call back.

## 2020-09-23 NOTE — Telephone Encounter (Signed)
The pt son returned nurse call. He states the pt do have a monitor. He took it out the box and plugged it in Saturday. I checked Merlin and it did communicate and check for alerts 09/22/2020. It did not send a full transmission.

## 2020-09-23 NOTE — Telephone Encounter (Signed)
Belenda Cruise a therapist from [facility] was calling to see when the pt can put weight on the arm. I let her speak with Leigh.

## 2020-10-09 ENCOUNTER — Ambulatory Visit (INDEPENDENT_AMBULATORY_CARE_PROVIDER_SITE_OTHER): Payer: Medicare Other | Admitting: Podiatry

## 2020-10-09 ENCOUNTER — Other Ambulatory Visit: Payer: Self-pay

## 2020-10-09 ENCOUNTER — Encounter: Payer: Self-pay | Admitting: Podiatry

## 2020-10-09 DIAGNOSIS — B351 Tinea unguium: Secondary | ICD-10-CM | POA: Diagnosis not present

## 2020-10-09 DIAGNOSIS — M79674 Pain in right toe(s): Secondary | ICD-10-CM | POA: Diagnosis not present

## 2020-10-09 DIAGNOSIS — M79675 Pain in left toe(s): Secondary | ICD-10-CM | POA: Diagnosis not present

## 2020-10-09 DIAGNOSIS — E1159 Type 2 diabetes mellitus with other circulatory complications: Secondary | ICD-10-CM | POA: Diagnosis not present

## 2020-10-09 NOTE — Progress Notes (Signed)
This patient returns to my office for at risk foot care.  This patient requires this care by a professional since this patient will be at risk due to having  Diabetes.  She presents with her daughter..  This patient has not been seen in six months and is now in a wheelchair.  This patient is unable to cut nails herselfsince the patient cannot reach her nails.These nails are painful walking and wearing shoes.  This patient presents for at risk foot care today.  General Appearance  Alert, conversant and in no acute stress.  Vascular  Dorsalis pedis and posterior tibial  pulses are absent   bilaterally.  Capillary return is within normal limits  bilaterally. Temperature is within normal limits  bilaterally.  Neurologic  Senn-Weinstein monofilament wire test diminished   bilaterally. Muscle power within normal limits bilaterally.  Nails Thick disfigured discolored nails with subungual debris  from hallux to fifth toes bilaterally. No evidence of bacterial infection or drainage bilaterally.  Orthopedic  No limitations of motion  feet .  No crepitus or effusions noted.  No bony pathology or digital deformities noted.  Skin  Dry scaly skin with no porokeratosis  B/L. bilaterally.  No signs of infections or ulcers noted.     Onychomycosis  Pain in right toe  Pain in left toe.  Consent was obtained for treatment procedures.  Debridement and grinding of long thick nails with clearing of subungual debris.  No infection or ulcer.     Return office visit   3 months.       Told patient to return for periodic foot care and evaluation due to potential at risk complications.   Helane Gunther DPM

## 2020-10-16 ENCOUNTER — Telehealth: Payer: Self-pay | Admitting: Hospice

## 2020-10-16 NOTE — Telephone Encounter (Signed)
Called patient to schedule the Palliative Consult, no answer - left message with reason for call along with my name and call back number - requesting a return call to schedule visit. 

## 2020-10-21 ENCOUNTER — Telehealth: Payer: Self-pay

## 2020-10-21 NOTE — Telephone Encounter (Signed)
Call received from Pt's rehab facility.  Advised Pt may resume normal activities 6 weeks after pacemaker implant.

## 2020-11-05 ENCOUNTER — Telehealth: Payer: Self-pay

## 2020-11-05 NOTE — Telephone Encounter (Signed)
Spoke with patient's relative Bridgette to schedule Palliative consult. Patient is now at [facility]. Will emailed Palliative team to change referral to facility.

## 2020-12-09 ENCOUNTER — Ambulatory Visit (INDEPENDENT_AMBULATORY_CARE_PROVIDER_SITE_OTHER): Payer: Medicare Other

## 2020-12-09 DIAGNOSIS — R001 Bradycardia, unspecified: Secondary | ICD-10-CM

## 2020-12-09 LAB — CUP PACEART REMOTE DEVICE CHECK
Battery Remaining Longevity: 137 mo
Battery Remaining Percentage: 95.5 %
Battery Voltage: 3.05 V
Brady Statistic RV Percent Paced: 1 %
Date Time Interrogation Session: 20211227020013
Implantable Lead Implant Date: 20210924
Implantable Lead Location: 753860
Implantable Pulse Generator Implant Date: 20210924
Lead Channel Impedance Value: 540 Ohm
Lead Channel Pacing Threshold Amplitude: 0.5 V
Lead Channel Pacing Threshold Pulse Width: 0.4 ms
Lead Channel Sensing Intrinsic Amplitude: 12 mV
Lead Channel Setting Pacing Amplitude: 3.5 V
Lead Channel Setting Pacing Pulse Width: 0.4 ms
Lead Channel Setting Sensing Sensitivity: 2 mV
Pulse Gen Model: 1272
Pulse Gen Serial Number: 3828073

## 2020-12-17 ENCOUNTER — Encounter: Payer: Medicare Other | Admitting: Internal Medicine

## 2020-12-23 NOTE — Progress Notes (Signed)
Remote pacemaker transmission.   

## 2021-02-11 ENCOUNTER — Ambulatory Visit: Payer: Medicare Other | Admitting: Podiatry

## 2021-03-10 ENCOUNTER — Ambulatory Visit (INDEPENDENT_AMBULATORY_CARE_PROVIDER_SITE_OTHER): Payer: Medicare Other

## 2021-03-10 DIAGNOSIS — R001 Bradycardia, unspecified: Secondary | ICD-10-CM | POA: Diagnosis not present

## 2021-03-11 LAB — CUP PACEART REMOTE DEVICE CHECK
Battery Remaining Longevity: 136 mo
Battery Remaining Percentage: 95.5 %
Battery Voltage: 3.05 V
Brady Statistic RV Percent Paced: 1 %
Date Time Interrogation Session: 20220328020013
Implantable Lead Implant Date: 20210924
Implantable Lead Location: 753860
Implantable Pulse Generator Implant Date: 20210924
Lead Channel Impedance Value: 560 Ohm
Lead Channel Pacing Threshold Amplitude: 0.5 V
Lead Channel Pacing Threshold Pulse Width: 0.4 ms
Lead Channel Sensing Intrinsic Amplitude: 12 mV
Lead Channel Setting Pacing Amplitude: 3.5 V
Lead Channel Setting Pacing Pulse Width: 0.4 ms
Lead Channel Setting Sensing Sensitivity: 2 mV
Pulse Gen Model: 1272
Pulse Gen Serial Number: 3828073

## 2021-03-20 NOTE — Progress Notes (Signed)
Remote pacemaker transmission.   

## 2021-05-02 ENCOUNTER — Other Ambulatory Visit: Payer: Self-pay

## 2021-05-02 ENCOUNTER — Non-Acute Institutional Stay: Payer: Medicare Other | Admitting: Adult Health Nurse Practitioner

## 2021-05-02 ENCOUNTER — Encounter: Payer: Self-pay | Admitting: Adult Health Nurse Practitioner

## 2021-05-02 VITALS — HR 73 | Ht <= 58 in | Wt 104.6 lb

## 2021-05-02 DIAGNOSIS — R634 Abnormal weight loss: Secondary | ICD-10-CM

## 2021-05-02 DIAGNOSIS — Z515 Encounter for palliative care: Secondary | ICD-10-CM

## 2021-05-02 DIAGNOSIS — F0390 Unspecified dementia without behavioral disturbance: Secondary | ICD-10-CM

## 2021-05-02 NOTE — Progress Notes (Signed)
[facility] Consult Note Telephone: [phone]  Fax: [phone]    Date of encounter: 05/02/21 PATIENT NAME: Angelica Lane [address] [name] [address]   [phone] (home)  DOB: 25-May-1928 MRN: [id number] PRIMARY CARE PROVIDER:    Dr. Maryella Shivers  REFERRING PROVIDER:   Roddie Mc, PA  RESPONSIBLE PARTY:    Contact Information    Name Relation Home Work Mobile   Fahmy,Bridgette Relative [phone]  [phone]   Jamerica, Snavely [phone]  [phone]       I met face to face with patient in facility. Palliative Care was asked to follow this patient by consultation request of Roddie Mc, PA to address advance care planning and complex medical decision making. This is the initial visit.   Called daughter to update on today's visit.  Left VM with reason for call and contact info                                  ASSESSMENT AND PLAN / RECOMMENDATIONS:   Advance Care Planning/Goals of Care: Goals include to maximize quality of life and symptom management.   CODE STATUS: DNR  Symptom Management/Plan:  Dementia: Functionally patient appears to be at baseline.  Patient is able to answer questions but answers may be unreliable secondary to the dementia.  She follows commands well.  Staff does report that at times she does refuse care.  Continue supportive care at facility  Eating [facility]: Patient is having weight loss which is most likely related to her dementia.  She is having times where she eats 25% or less of her meals but on average is eating 50 to 75% of her meals continue to monitor weight and add supplementation as needed.  Follow up Palliative Care Visit: Palliative care will continue to follow for complex medical decision making, advance care planning, and clarification of goals. Return 4-8 weeks or prn.  I spent 35 minutes providing this consultation. More than 50% of the time in  this consultation was spent in counseling and care coordination.  PPS: 40%  HOSPICE ELIGIBILITY/DIAGNOSIS: TBD  Chief Complaint: initial palliative visit  HISTORY OF PRESENT ILLNESS:  Angelica Lane is a 87 y.o. year old female  with dementia, PCM, syncope and collapse, CKD stage III, DM T2, HTN, HLD, repeated falls, PVD, GERD.  Patient was seen in ED on 08/21/2020 for a fall and x-ray of right knee and CTA of head and cervical spine were negative for acute findings.  Patient had hospitalization 9/22 through 09/09/2021 for syncope and symptomatic bradycardia.  She did have pacemaker placed.  Patient denies any concerns today.  HPI/ROS unreliable due to dementia.  Patient has had a 5 pound weight loss in the past month.  On 03/21/2021 she weighed 85 pounds in May she weighs 104.6 pounds.  Per documentation in EMR she is eating on average 50- 75% of her meals.  It is documented that some meals she does not eat and then other meals she may eat up to 25%.  She is standby assist with walking and ADLs.  Staff does report that she mostly just sits in her chair in her room and watches Friends.  History obtained from review of EMR, discussion with primary team, and interview with facility staff and Angelica Lane.  I reviewed available labs, medications, imaging, studies and related documents from the EMR.  Records reviewed  and summarized above.   Physical Exam:  Constitutional: NAD General: frail appearing, thin EYES: anicteric sclera, lids intact, no discharge  ENMT: intact hearing, oral mucous membranes moist CV: S1S2, RRR, no LE edema Pulmonary: LCTA, no increased work of breathing, no cough Abdomen:  normo-active BS + 4 quadrants, soft and non tender MSK:  moves all extremities, ambulatory with walker Skin: warm and dry, no rashes or wounds on visible skin Neuro:  A&O to person and place   CURRENT PROBLEM LIST:  Patient Active Problem List   Diagnosis Date Noted  . Palliative care by  specialist   . Goals of care, counseling/discussion   . Encounter for hospice care discussion   . DNR (do not resuscitate)   . DNI (do not intubate)   . Pacemaker   . Generalized weakness   . Symptomatic bradycardia 09/04/2020  . Dementia without behavioral disturbance ([place]) 09/04/2020  . Diabetes ([place]) 09/04/2020  . Essential hypertension 09/04/2020   PAST MEDICAL HISTORY:  Active Ambulatory Problems    Diagnosis Date Noted  . Symptomatic bradycardia 09/04/2020  . Dementia without behavioral disturbance ([place]) 09/04/2020  . Diabetes ([place]) 09/04/2020  . Essential hypertension 09/04/2020  . Palliative care by specialist   . Goals of care, counseling/discussion   . Encounter for hospice care discussion   . DNR (do not resuscitate)   . DNI (do not intubate)   . Pacemaker   . Generalized weakness    Resolved Ambulatory Problems    Diagnosis Date Noted  . No Resolved Ambulatory Problems   Past Medical History:  Diagnosis Date  . Dementia ([place])   . Depression   . Diabetes mellitus without complication ([place])   . High cholesterol   . Hypertension    SOCIAL HX:  Social History   Tobacco Use  . Smoking status: Never Smoker  . Smokeless tobacco: Never Used  Substance Use Topics  . Alcohol use: Not Currently   FAMILY HX:  Family History  Problem Relation Age of Onset  . Healthy Mother   . Healthy Father       ALLERGIES: No Known Allergies   PERTINENT MEDICATIONS:  Outpatient Encounter Medications as of 05/02/2021  Medication Sig  . amLODipine (NORVASC) 10 MG tablet Take 1 tablet (10 mg total) by mouth daily.  Marland Kitchen ammonium lactate (LAC-HYDRIN) 12 % lotion Apply topically every other day.   . Calcium Carbonate-Vitamin D (CALCIUM 500/D PO) Take 500 mg by mouth daily.   . carvedilol (COREG) 6.25 MG tablet Take 6.25 mg by mouth 2 (two) times daily with a meal.   . donepezil (ARICEPT) 5 MG tablet Take 5 mg by mouth at bedtime.   Marland Kitchen escitalopram (LEXAPRO) 5 MG tablet Take 5 mg  by mouth every morning.   . ezetimibe (ZETIA) 10 MG tablet Take 10 mg by mouth daily.   . insulin aspart (NOVOLOG) 100 UNIT/ML injection Inject into the skin once. Daily, U-insulin aspart  . ketoconazole (NIZORAL) 2 % cream Apply 1 application topically every other day.  . losartan (COZAAR) 100 MG tablet Take 100 mg by mouth daily.   . memantine (NAMENDA) 5 MG tablet Take 5 mg by mouth 2 (two) times daily.  . metFORMIN (GLUCOPHAGE-XR) 500 MG 24 hr tablet metformin ER 500 mg tablet,extended release 24 hr  . Multiple Vitamins-Minerals (ONE-A-DAY 50 PLUS PO) Take 1 tablet by mouth daily.  Marland Kitchen OLANZapine (ZYPREXA) 2.5 MG tablet Take 2.5 mg by mouth every morning.   . simvastatin (ZOCOR) 10 MG  tablet Take 10 mg by mouth every evening.   . Skin Protectants, Misc. (EUCERIN) cream Apply topically as needed for dry skin. Intensive repair emollient combination,daily prn  . triamcinolone ointment (KENALOG) 0.1 % Apply 1 application topically 2 (two) times daily. Once daily Mon.,Wed.,Fri.  . [DISCONTINUED] divalproex (DEPAKOTE SPRINKLE) 125 MG capsule divalproex 125 mg capsule,delayed release sprinkle twice a day  . [DISCONTINUED] metoprolol succinate (TOPROL-XL) 100 MG 24 hr tablet metoprolol succinate ER 200 mg tablet,extended release 24 hr in the morning   No facility-administered encounter medications on file as of 05/02/2021.    Thank you for the opportunity to participate in the care of Ms. Ruis.  The palliative care team will continue to follow. Please call our office at [phone] if we can be of additional assistance.   Disha Cottam Jenetta Downer, NP , DNP  This chart was dictated using voice recognition software. Despite best efforts to proofread, errors can occur which can change the documentation meaning.   COVID-19 PATIENT SCREENING TOOL Asked and negative response unless otherwise noted:   Have you had symptoms of covid, tested positive or been in contact with someone with symptoms/positive test  in the past 5-10 days? negative

## 2021-06-09 ENCOUNTER — Ambulatory Visit (INDEPENDENT_AMBULATORY_CARE_PROVIDER_SITE_OTHER): Payer: Medicare Other

## 2021-06-09 DIAGNOSIS — R001 Bradycardia, unspecified: Secondary | ICD-10-CM

## 2021-06-09 LAB — CUP PACEART REMOTE DEVICE CHECK
Battery Remaining Longevity: 130 mo
Battery Remaining Percentage: 95.5 %
Battery Voltage: 3.05 V
Brady Statistic RV Percent Paced: 1 %
Date Time Interrogation Session: 20220627020013
Implantable Lead Implant Date: 20210924
Implantable Lead Location: 753860
Implantable Pulse Generator Implant Date: 20210924
Lead Channel Impedance Value: 460 Ohm
Lead Channel Pacing Threshold Amplitude: 0.5 V
Lead Channel Pacing Threshold Pulse Width: 0.4 ms
Lead Channel Sensing Intrinsic Amplitude: 9.6 mV
Lead Channel Setting Pacing Amplitude: 3.5 V
Lead Channel Setting Pacing Pulse Width: 0.4 ms
Lead Channel Setting Sensing Sensitivity: 2 mV
Pulse Gen Model: 1272
Pulse Gen Serial Number: 3828073

## 2021-06-26 NOTE — Progress Notes (Signed)
Remote pacemaker transmission.   

## 2021-08-29 ENCOUNTER — Non-Acute Institutional Stay: Payer: Medicare Other | Admitting: Student

## 2021-08-29 ENCOUNTER — Other Ambulatory Visit: Payer: Self-pay

## 2021-08-29 DIAGNOSIS — Z515 Encounter for palliative care: Secondary | ICD-10-CM

## 2021-08-29 DIAGNOSIS — E46 Unspecified protein-calorie malnutrition: Secondary | ICD-10-CM

## 2021-08-29 DIAGNOSIS — F0390 Unspecified dementia without behavioral disturbance: Secondary | ICD-10-CM

## 2021-09-01 NOTE — Progress Notes (Signed)
[facility] Consult Note Telephone: [phone]  Fax: [phone]    Date of encounter: 08/29/2021  PATIENT NAME: Angelica Lane [address] [name] [address]   [phone] (home)  DOB: 04/27/1928 MRN: [id number] PRIMARY CARE PROVIDER:    Dr. Lincoln Brigham  REFERRING PROVIDER:   Dr. Lincoln Brigham  RESPONSIBLE PARTY:    Contact Information     Name Relation Home Work Mobile   Wadhwa,Bridgette Relative [phone]  [phone]   Artina, Minella [phone]  [phone]        I met face to face with patient in the facility. Palliative Care was asked to follow this patient by consultation request of  Dr. Shon Hough to address advance care planning and complex medical decision making. This is a follow up visit.                                   ASSESSMENT AND PLAN / RECOMMENDATIONS:   Advance Care Planning/Goals of Care: Goals include to maximize quality of life and symptom management.  CODE STATUS: DNR  Symptom Management/Plan:  Dementia-reorient/redirect as needed. Staff to assist with adl's as needed. Continue Aricept and Namenda as directed. Monitor for falls/safety.   Protein calorie malnutrition-patient's weight has been stable. She is eating usually 50-75% of meals and is drinking nutritional supplements. Continue to monitor for weight loss; routine weights per facility policy. Continue nutritional supplements.   Follow up Palliative Care Visit: Palliative care will continue to follow for complex medical decision making, advance care planning, and clarification of goals. Return in 8 weeks or prn.  I spent 15 minutes providing this consultation. More than 50% of the time in this consultation was spent in counseling and care coordination.   PPS: 40%  HOSPICE ELIGIBILITY/DIAGNOSIS: TBD  Chief Complaint: Palliative Medicine follow up visit.  HISTORY OF PRESENT ILLNESS:  Angelica Lane is a 87 y.o. year old female  with dementia, depression, diabetes, hypertension, hyperlipidemia.   Patient resides at [facility]. She states she has been doing well.  Staff report patient being stable; no recent changes. She denies having any pain, chest pain, shortness of breath, constipation, nausea. She denies any lower extremity edema.  She states her appetite has been good.  She eats meals in her room mostly' eating 50-75% of most meals. She ambulates with walker. She states her mood has been good.  She endorses sleeping well at night.  No recent ED visits or hospitalizations. A 10-point review of systems is negative, except for the pertinent positives and negatives detailed in the HPI.    History obtained from review of EMR, discussion with primary team, and interview with family, facility staff/caregiver and/or Ms. Meidinger.  I reviewed available labs, medications, imaging, studies and related documents from the EMR.  Records reviewed and summarized above.    Physical Exam: Weight: 107.8  Pulse 80, resp 18, b/p 120/ 60, sats 96% on room air Constitutional: NAD General: frail appearing, thin EYES: anicteric sclera, lids intact, no discharge  ENMT: intact hearing, oral mucous membranes moist, dentition intact CV: S1S2, RRR, no LE edema Pulmonary: LCTA, no increased work of breathing, no cough Abdomen:  normo-active BS + 4 quadrants, soft and non tender GU: deferred MSK: moves all extremities, ambulatory with walker Skin: warm and dry, no rashes or wounds on visible skin Neuro: generalized weakness, A & O x 2  Psych: non-anxious affect, pleasant Hem/lymph/immuno: no widespread bruising   Thank you for the opportunity to participate in the care of Angelica Lane.  The palliative care team will continue to follow. Please call our office at [phone] if we can be of additional assistance.   Ezekiel Slocumb, NP   COVID-19 PATIENT SCREENING TOOL Asked and negative  response unless otherwise noted:   Have you had symptoms of covid, tested positive or been in contact with someone with symptoms/positive test in the past 5-10 days? No

## 2021-09-08 ENCOUNTER — Ambulatory Visit (INDEPENDENT_AMBULATORY_CARE_PROVIDER_SITE_OTHER): Payer: Medicare Other

## 2021-09-08 DIAGNOSIS — R001 Bradycardia, unspecified: Secondary | ICD-10-CM

## 2021-09-08 LAB — CUP PACEART REMOTE DEVICE CHECK
Battery Remaining Longevity: 130 mo
Battery Remaining Percentage: 95.5 %
Battery Voltage: 3.05 V
Brady Statistic RV Percent Paced: 1 %
Date Time Interrogation Session: 20220926020015
Implantable Lead Implant Date: 20210924
Implantable Lead Location: 753860
Implantable Pulse Generator Implant Date: 20210924
Lead Channel Impedance Value: 490 Ohm
Lead Channel Pacing Threshold Amplitude: 0.5 V
Lead Channel Pacing Threshold Pulse Width: 0.4 ms
Lead Channel Sensing Intrinsic Amplitude: 11.4 mV
Lead Channel Setting Pacing Amplitude: 3.5 V
Lead Channel Setting Pacing Pulse Width: 0.4 ms
Lead Channel Setting Sensing Sensitivity: 2 mV
Pulse Gen Model: 1272
Pulse Gen Serial Number: 3828073

## 2021-09-15 NOTE — Progress Notes (Signed)
Remote pacemaker transmission.   

## 2021-10-09 ENCOUNTER — Encounter: Payer: Self-pay | Admitting: Internal Medicine

## 2021-10-16 ENCOUNTER — Telehealth: Payer: Self-pay

## 2021-10-16 NOTE — Telephone Encounter (Signed)
Alert remote reviewed. Normal device function.   Two HVR detected, appear to be irregular in origin, most likely AF with RVR, sent to triage Next remote 12/09/2021   Noted that patient has not been seen since her wound check.  91 day check 12/2020 was cancelled due to lack of transportation.  Pt device still programmed wth acute settings.    Spoke with pt son, Mat Carne.  He states pt is in a nursing facility.  Advised of importance that pt needs to come to office for check and reprogramming.  Scheduled appt for 12/23/21 with Dr. Ladona Ridgel.  Son will contact nursing facility to inform of appt and request transportation.

## 2021-10-16 NOTE — Telephone Encounter (Signed)
This encounter was created in error - please disregard.

## 2021-12-09 ENCOUNTER — Ambulatory Visit (INDEPENDENT_AMBULATORY_CARE_PROVIDER_SITE_OTHER): Payer: Medicare Other

## 2021-12-09 DIAGNOSIS — R001 Bradycardia, unspecified: Secondary | ICD-10-CM

## 2021-12-09 LAB — CUP PACEART REMOTE DEVICE CHECK
Battery Remaining Longevity: 127 mo
Battery Remaining Percentage: 95 %
Battery Voltage: 3.05 V
Brady Statistic RV Percent Paced: 1 %
Date Time Interrogation Session: 20221226020016
Implantable Lead Implant Date: 20210924
Implantable Lead Location: 753860
Implantable Pulse Generator Implant Date: 20210924
Lead Channel Impedance Value: 480 Ohm
Lead Channel Pacing Threshold Amplitude: 0.5 V
Lead Channel Pacing Threshold Pulse Width: 0.4 ms
Lead Channel Sensing Intrinsic Amplitude: 11.4 mV
Lead Channel Setting Pacing Amplitude: 3.5 V
Lead Channel Setting Pacing Pulse Width: 0.4 ms
Lead Channel Setting Sensing Sensitivity: 2 mV
Pulse Gen Model: 1272
Pulse Gen Serial Number: 3828073

## 2021-12-17 NOTE — Progress Notes (Signed)
Remote pacemaker transmission.   

## 2021-12-23 ENCOUNTER — Other Ambulatory Visit: Payer: Self-pay

## 2021-12-23 ENCOUNTER — Ambulatory Visit (INDEPENDENT_AMBULATORY_CARE_PROVIDER_SITE_OTHER): Payer: Medicare Other | Admitting: Internal Medicine

## 2021-12-23 ENCOUNTER — Encounter: Payer: Self-pay | Admitting: Internal Medicine

## 2021-12-23 VITALS — BP 110/58 | HR 59 | Ht <= 58 in | Wt 103.0 lb

## 2021-12-23 DIAGNOSIS — Z950 Presence of cardiac pacemaker: Secondary | ICD-10-CM

## 2021-12-23 DIAGNOSIS — R001 Bradycardia, unspecified: Secondary | ICD-10-CM | POA: Diagnosis not present

## 2021-12-23 NOTE — Progress Notes (Signed)
HPI Angelica Lane returns today for PPM followup. She is a pleasant frail 88 yo woman with a h/o sinus node dysfunction who presented with severe sinus pauses despite stopping the coreg and underwent PPM insertion. She has done amazingly well in the interim with no chest pain or sob. She has not had syncope but admits to being sedentary which her son corroborates. She does not have peripheral edema. Her bp has been fairly well controlled. She c/o feeling cold. No Known Allergies   Current Outpatient Medications  Medication Sig Dispense Refill   amLODipine (NORVASC) 10 MG tablet Take 1 tablet (10 mg total) by mouth daily.     ammonium lactate (LAC-HYDRIN) 12 % lotion Apply topically every other day.   2   Calcium Carbonate-Vitamin D (CALCIUM 500/D PO) Take 500 mg by mouth daily.      carvedilol (COREG) 6.25 MG tablet Take 6.25 mg by mouth 2 (two) times daily with a meal.      donepezil (ARICEPT) 5 MG tablet Take 5 mg by mouth at bedtime.   1   escitalopram (LEXAPRO) 5 MG tablet Take 5 mg by mouth every morning.      ezetimibe (ZETIA) 10 MG tablet Take 10 mg by mouth daily.      insulin aspart (NOVOLOG) 100 UNIT/ML injection Inject into the skin once. Daily, U-insulin aspart     ketoconazole (NIZORAL) 2 % cream Apply 1 application topically every other day.     losartan (COZAAR) 100 MG tablet Take 100 mg by mouth daily.      memantine (NAMENDA) 5 MG tablet Take 5 mg by mouth 2 (two) times daily.     metFORMIN (GLUCOPHAGE-XR) 500 MG 24 hr tablet metformin ER 500 mg tablet,extended release 24 hr     Multiple Vitamins-Minerals (ONE-A-DAY 50 PLUS PO) Take 1 tablet by mouth daily.     OLANZapine (ZYPREXA) 2.5 MG tablet Take 2.5 mg by mouth every morning.      simvastatin (ZOCOR) 10 MG tablet Take 10 mg by mouth every evening.      Skin Protectants, Misc. (EUCERIN) cream Apply topically as needed for dry skin. Intensive repair emollient combination,daily prn     triamcinolone ointment  (KENALOG) 0.1 % Apply 1 application topically 2 (two) times daily. Once daily Mon.,Wed.,Fri.     No current facility-administered medications for this visit.     Past Medical History:  Diagnosis Date   Dementia (HCC)    Depression    Diabetes mellitus without complication (HCC)    High cholesterol    Hypertension    Symptomatic bradycardia 09/05/2020    ROS:   All systems reviewed and negative except as noted in the HPI.   Past Surgical History:  Procedure Laterality Date   APPENDECTOMY     PACEMAKER IMPLANT N/A 09/06/2020   Procedure: PACEMAKER IMPLANT;  Surgeon: Marinus Maw, MD;  Location: [facility] INVASIVE CV LAB;  Service: Cardiovascular;  Laterality: N/A;     Family History  Problem Relation Age of Onset   Healthy Mother    Healthy Father      Social History   Socioeconomic History   Marital status: Single    Spouse name: Not on file   Number of children: Not on file   Years of education: Not on file   Highest education level: Not on file  Occupational History   Not on file  Tobacco Use   Smoking status: Never   Smokeless tobacco: Never  Vaping Use   Vaping Use: Never used  Substance and Sexual Activity   Alcohol use: Not Currently   Drug use: Never   Sexual activity: Not on file  Other Topics Concern   Not on file  Social History Narrative   Not on file   Social Determinants of Health   Financial Resource Strain: Not on file  Food Insecurity: Not on file  Transportation Needs: Not on file  Physical Activity: Not on file  Stress: Not on file  Social Connections: Not on file  Intimate Partner Violence: Not on file     BP (!) 110/58    Pulse (!) 59    Ht 4\' 10"  (1.473 m)    Wt 103 lb (46.7 kg)    SpO2 99%    BMI 21.53 kg/m   Physical Exam:  Chronically ill appearing NAD HEENT: Unremarkable Neck:  No JVD, no thyromegally Lymphatics:  No adenopathy Back:  No CVA tenderness Lungs:  Clear with no wheezes HEART:  Regular rate rhythm, no  murmurs, no rubs, no clicks Abd:  soft, positive bowel sounds, no organomegally, no rebound, no guarding Ext:  2 plus pulses, no edema, no cyanosis, no clubbing Skin:  No rashes no nodules Neuro:  CN II through XII intact, motor grossly intact  EKG - nsr   DEVICE  Normal device function.  See PaceArt for details.   Assess/Plan:  Sinus node dysfunction - she is improved s/p PPM insertion. PPM - her [employer] DDD PM is working normally. We will recheck in several months. HTN - her bp is well controlled today. We will follow. Dementia - she appears stable and is pleasantly demented today.  Tatum Corl,MD

## 2021-12-23 NOTE — Patient Instructions (Signed)
Medication Instructions:  Your physician recommends that you continue on your current medications as directed. Please refer to the Current Medication list given to you today.  Labwork: None ordered.  Testing/Procedures: None ordered.  Follow-Up: Your physician wants you to follow-up in: one year with Lewayne Bunting, MD or one of the following Advanced Practice Providers on your designated Care Team:   Francis Dowse, [place] Casimiro Needle "Mardelle Matte" Lanna Poche, [place]  Remote monitoring is used to monitor your Pacemaker from home. This monitoring reduces the number of office visits required to check your device to one time per year. It allows [place] to keep an eye on the functioning of your device to ensure it is working properly. You are scheduled for a device check from home on 03/09/2022. You may send your transmission at any time that day. If you have a wireless device, the transmission will be sent automatically. After your physician reviews your transmission, you will receive a postcard with your next transmission date.  Any Other Special Instructions Will Be Listed Below (If Applicable).  If you need a refill on your cardiac medications before your next appointment, please call your pharmacy.

## 2021-12-26 ENCOUNTER — Other Ambulatory Visit: Payer: Self-pay

## 2021-12-26 ENCOUNTER — Non-Acute Institutional Stay: Payer: Medicare Other | Admitting: Student

## 2021-12-26 DIAGNOSIS — F0390 Unspecified dementia without behavioral disturbance: Secondary | ICD-10-CM

## 2021-12-26 DIAGNOSIS — Z515 Encounter for palliative care: Secondary | ICD-10-CM

## 2021-12-26 DIAGNOSIS — E46 Unspecified protein-calorie malnutrition: Secondary | ICD-10-CM

## 2021-12-26 NOTE — Progress Notes (Signed)
[facility] Palliative Care Consult Note Telephone: [phone]  Fax: [phone]    Date of encounter: 12/26/21 3:35 PM PATIENT NAME: Angelica Lane [address] [name]  [address]   [phone] (home)  DOB: 03/06/1928 MRN: [id number] PRIMARY CARE PROVIDER:    Dr. Shon Baton PROVIDER:   Dr. Shon Hough  RESPONSIBLE PARTY:    Contact Information     Name Relation Home Work Mobile   Justo,Bridgette Relative [phone]  [phone]   Aloria, Looper [phone]  [phone]        I met face to face with patient and family in the facility. Palliative Care was asked to follow this patient by consultation request of  Dr. Shon Hough to address advance care planning and complex medical decision making. This is a follow up visit.                                   ASSESSMENT AND PLAN / RECOMMENDATIONS:   Advance Care Planning/Goals of Care: Goals include to maximize quality of life and symptom management.   CODE STATUS: DNR  Symptom Management/Plan:  Dementia-reorient/redirect as needed. Staff to assist with adl's as needed. Continue Aricept and Namenda as directed. Monitor for falls/safety. monitor for cognitive and functional declines.    Protein calorie malnutrition-patient's weight has been stable. Fair appetite; drinking nutritional supplements. Continue to monitor for weight loss; routine weights per facility policy. Continue nutritional supplements.   Follow up Palliative Care Visit: Palliative care will continue to follow for complex medical decision making, advance care planning, and clarification of goals. Return in 8-12 weeks or prn.   This visit was coded based on medical decision making (MDM).  PPS: 40%  HOSPICE ELIGIBILITY/DIAGNOSIS: TBD  Chief Complaint: Palliative Medicine follow up visit.   HISTORY OF PRESENT ILLNESS:  Angelica Lane is a 88 y.o. year old female  with dementia,  depression, diabetes, hypertension, hyperlipidemia.   Patient resides at [facility]. Patient denies any needs. Staff report patient being stable; no recent changes. She denies having any pain, chest pain, shortness of breath, constipation, nausea. She denies any lower extremity edema. She is sleeping well at night. No recent falls. No recent infections. No ED visits or hospitalizations. A 10-point review of systems is negative, except for the pertinent positives and negatives detailed in the HPI.     History obtained from review of EMR, discussion with primary team, and interview with family, facility staff/caregiver and/or Ms. Vaness.  I reviewed available labs, medications, imaging, studies and related documents from the EMR.  Records reviewed and summarized above.    Physical Exam: Weight: 101 pounds Pulse 60, resp 16, b/p 118/62, sats 99% on room air Constitutional: NAD General: frail appearing, thin EYES: anicteric sclera, lids intact, no discharge  ENMT: intact hearing, oral mucous membranes moist, dentition intact CV: S1S2, RRR, no LE edema Pulmonary: LCTA, no increased work of breathing, no cough, room air Abdomen:normo-active BS + 4 quadrants, soft and non tender, no ascites GU: deferred MSK: moves all extremities, ambulatory with walker Skin: warm and dry, no rashes or wounds on visible skin Neuro:  no generalized weakness, A & O x 2 Psych: non-anxious affect, pleasant Hem/lymph/immuno: no widespread bruising   Thank you for the opportunity to participate in the care of Ms. Houseworth.  The palliative care team will continue to follow. Please call our office at [phone] if  we can be of additional assistance.   Ezekiel Slocumb, NP   COVID-19 PATIENT SCREENING TOOL Asked and negative response unless otherwise noted:   Have you had symptoms of covid, tested positive or been in contact with someone with symptoms/positive test in the past 5-10 days? No

## 2022-02-26 IMAGING — CT CT HEAD W/O CM
3 series · 15 of 46 positions shown, 18 images · non-contrast
Comparison: 03/23/2018

CLINICAL DATA: Fell this morning, facial trauma

EXAM:
CT HEAD WITHOUT CONTRAST
CT CERVICAL SPINE WITHOUT CONTRAST
TECHNIQUE: Multidetector CT imaging of the head and cervical spine was
performed following the standard protocol without intravenous
contrast. Multiplanar CT image reconstructions of the cervical spine
were also generated.

[Series 3: head wo · axial · 0.38mm/px · z∈[+1441,+1561]mm · 9 of 29 slices shown, 12 images]
[im 3/29  brain]
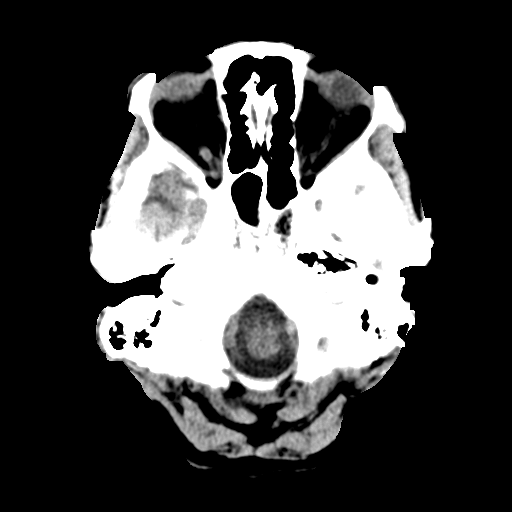
[im 3/29  bone]
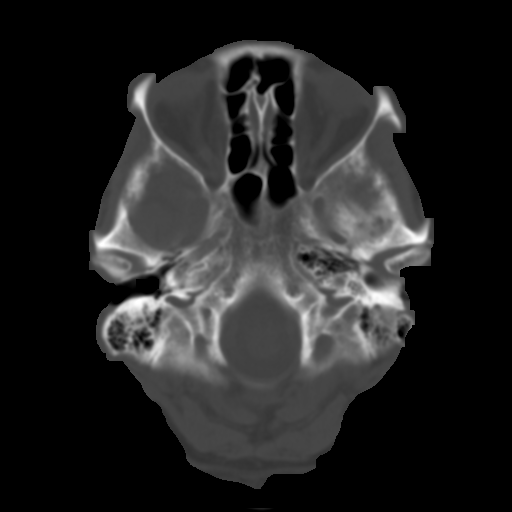
[im 6/29  brain]
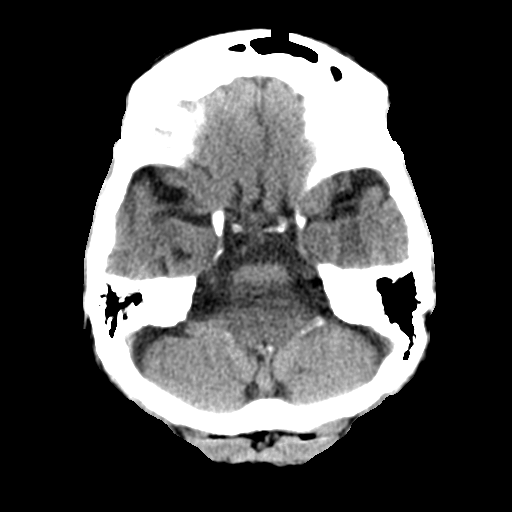
[im 9/29  brain]
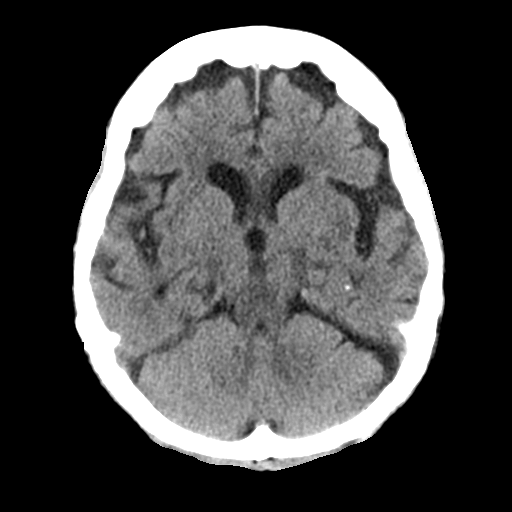
[im 12/29  brain]
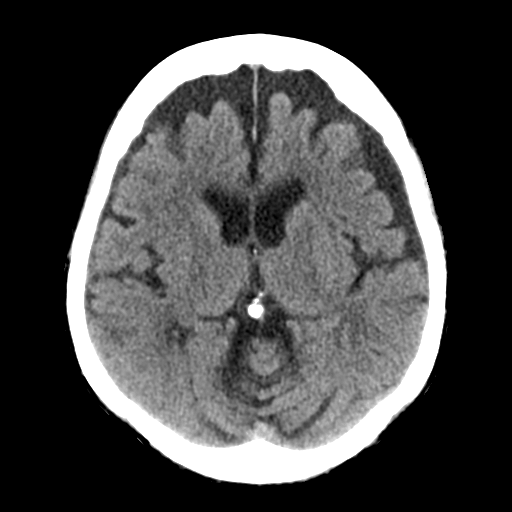
[im 15/29  brain]
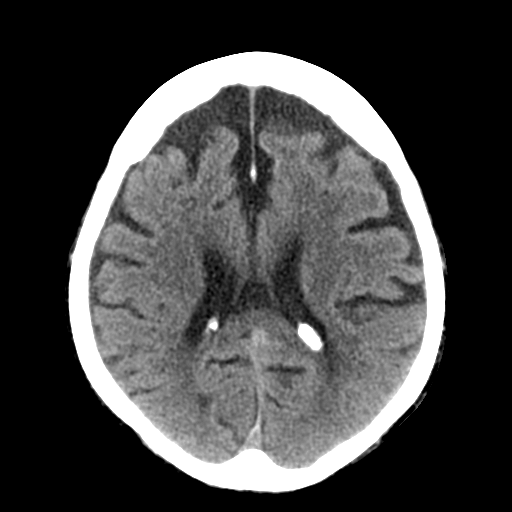
[im 15/29  bone]
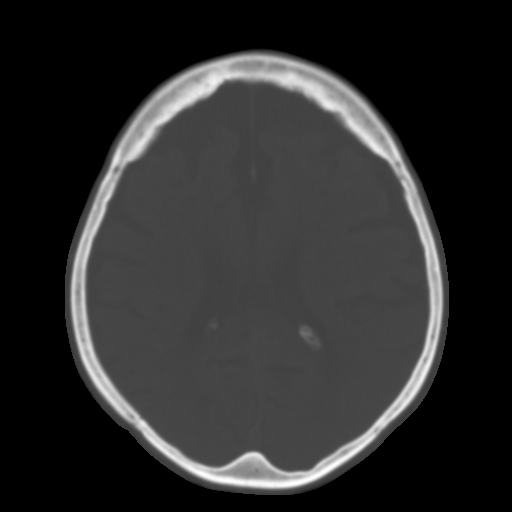
[im 18/29  brain]
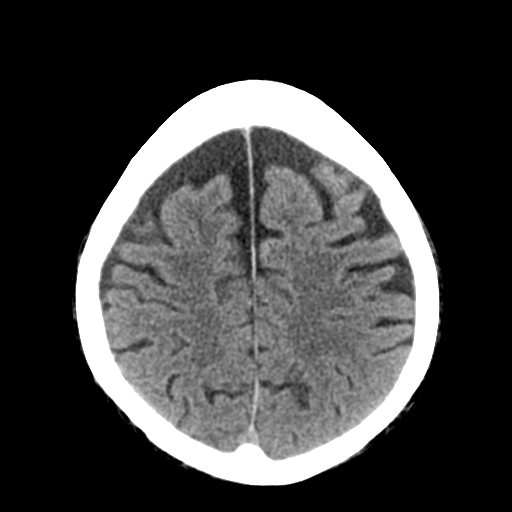
[im 21/29  brain]
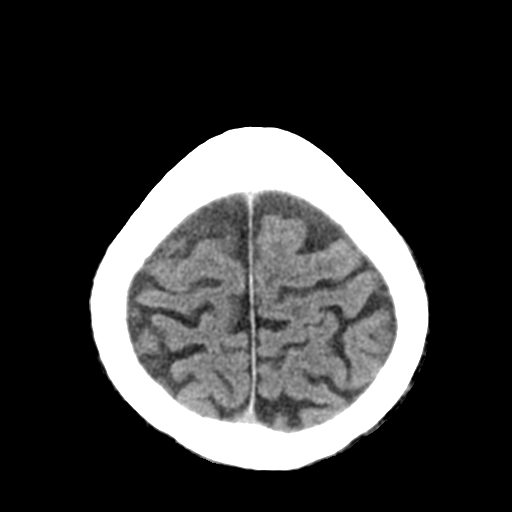
[im 24/29  brain]
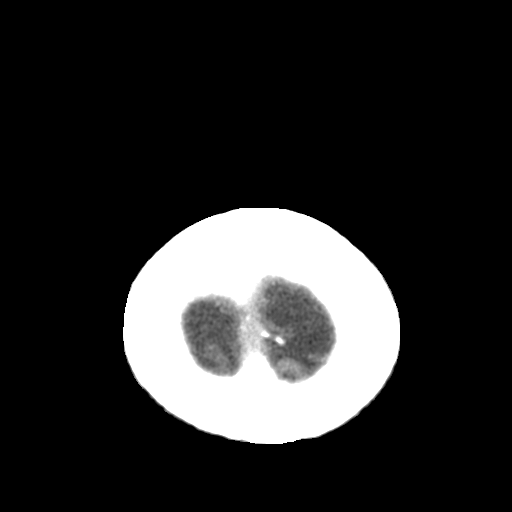
[im 27/29  brain]
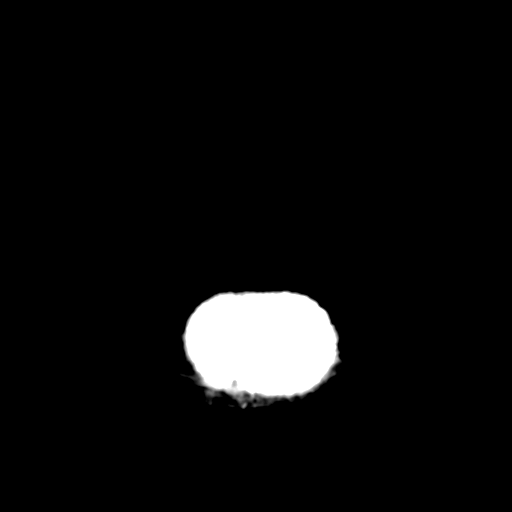
[im 27/29  bone]
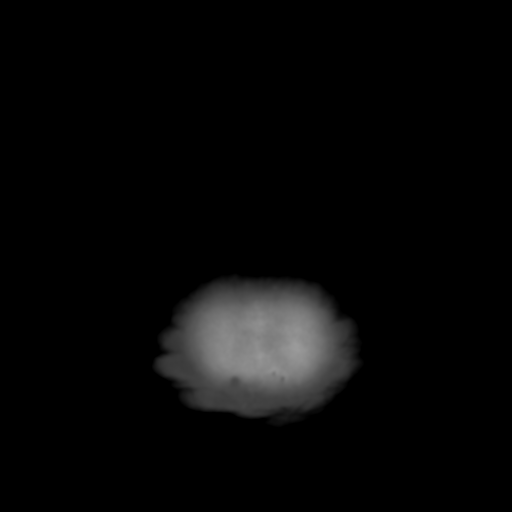

[Series 6: coronal soft tissue · coronal · 0.28mm/px · 3 of 62 slices shown]
[im 21/62  brain]
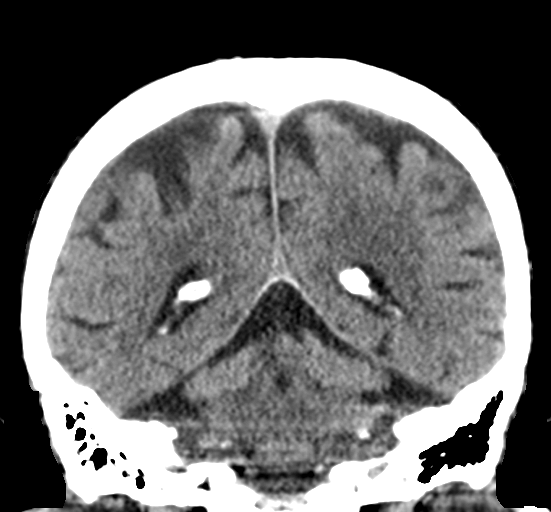
[im 28/62  brain]
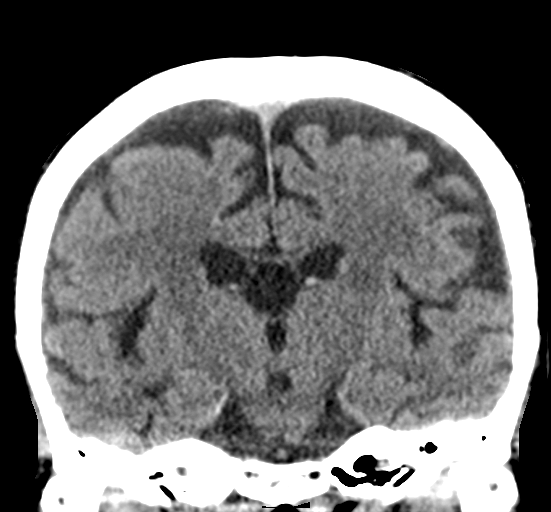
[im 34/62  brain]
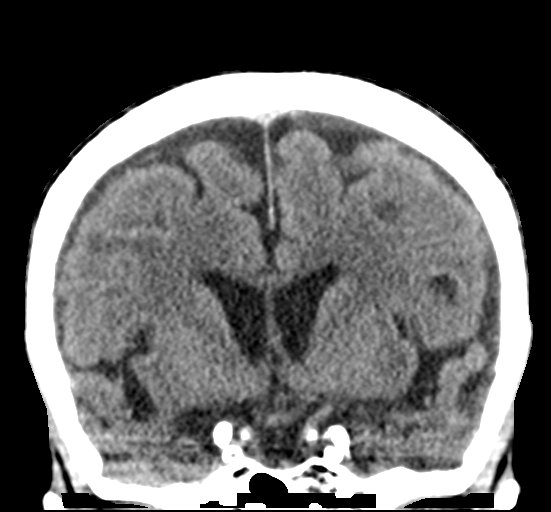

[Series 7: sagittal soft tissue · sagittal · 0.28mm/px · 3 of 52 slices shown]
[im 18/52  brain]
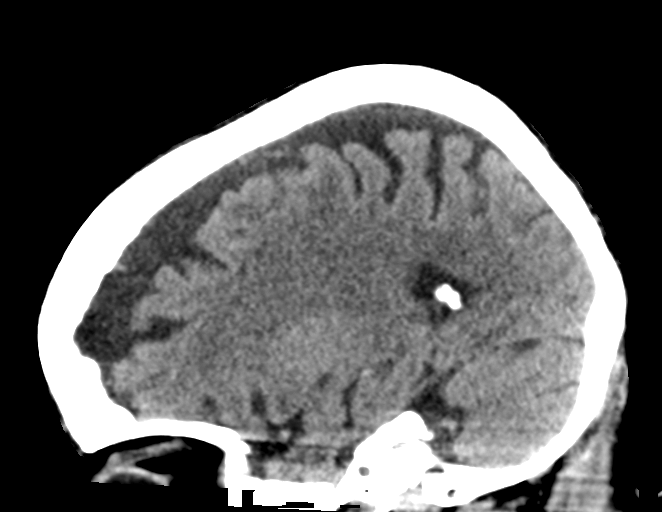
[im 26/52  brain]
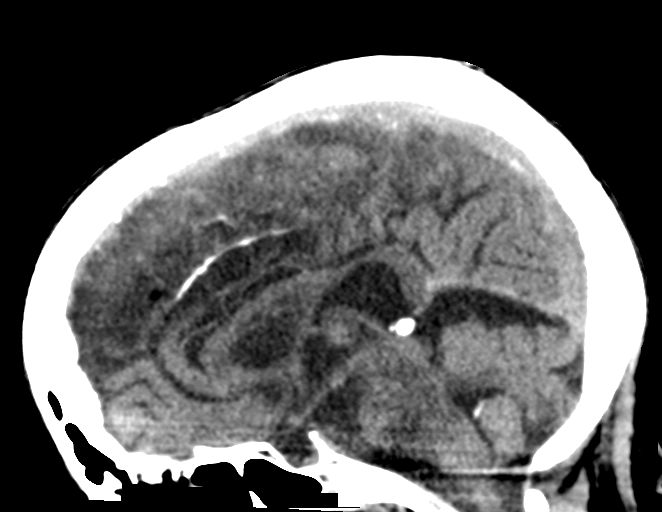
[im 35/52  brain]
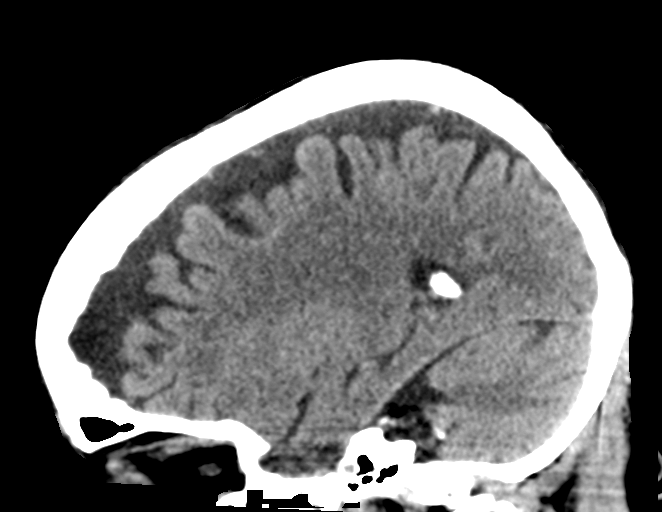

[15 of 46 positions shown; findings below may reference images not displayed]

FINDINGS: CT HEAD FINDINGS

Brain: No acute infarct or hemorrhage. Lateral ventricles and
midline structures are unremarkable. No acute extra-axial fluid
collections. Diffuse cerebral atrophy unchanged. No mass effect.

Vascular: No hyperdense vessel or unexpected calcification.

Skull: Normal. Negative for fracture or focal lesion.

Sinuses/Orbits: No acute finding.

Other: None.

CT CERVICAL SPINE FINDINGS

Alignment: There is straightening of the cervical spine, likely due
to extensive multilevel spondylosis and facet hypertrophy. Otherwise
alignment is anatomic.

Skull base and vertebrae: No acute displaced fracture.

Soft tissues and spinal canal: No prevertebral fluid or swelling. No
visible canal hematoma.

Disc levels: There is diffuse cervical facet hypertrophy, with bony
fusion across the left C2/C3 facet joints. There is multilevel
spondylosis most pronounced at C4-5, C5-6, and C6-7. No significant
central canal or neural foraminal encroachment.

Upper chest: Airway is patent.  Right apical scarring.

Other: Reconstructed images demonstrate no additional findings.
IMPRESSION: 1. No acute intracranial process.
2. No acute cervical spine fracture. Extensive multilevel
spondylosis and facet hypertrophy.

## 2022-03-06 ENCOUNTER — Other Ambulatory Visit: Payer: Self-pay

## 2022-03-06 ENCOUNTER — Non-Acute Institutional Stay: Payer: Medicare Other | Admitting: Student

## 2022-03-06 DIAGNOSIS — E46 Unspecified protein-calorie malnutrition: Secondary | ICD-10-CM

## 2022-03-06 DIAGNOSIS — F0390 Unspecified dementia without behavioral disturbance: Secondary | ICD-10-CM

## 2022-03-06 DIAGNOSIS — Z515 Encounter for palliative care: Secondary | ICD-10-CM

## 2022-03-06 NOTE — Progress Notes (Signed)
? ? ?Man[facility] ?Community Palliative Care Consult Note ?Telephone: [phone]  ?Fax: [phone]  ? ? ?Date of encounter: 03/06/22 ?9:13 AM ?PATIENT NAME: Angelica Lane ?[address] ?[name] [address]   ?[phone] (home)  ?DOB: 15-Mar-1928 ?MRN: [id number] ?PRIMARY CARE PROVIDER:    ?Dr. Shon Hough ? ?REFERRING PROVIDER:   ?Dr. Shon Hough ? ?RESPONSIBLE PARTY:    ?Contact Information   ? ? Name Relation Home Work Mobile  ? Skolnik,Bridgette Relative [phone]  [phone]  ? Shamara, Soza Son [phone]  [phone]  ? ?  ? ? ? ?I met face to face with patient in the facility. Palliative Care was asked to follow this patient by consultation request of  Dr. Shon Hough to address advance care planning and complex medical decision making. This is a follow up visit. ? ?                                 ASSESSMENT AND PLAN / RECOMMENDATIONS:  ? ?Advance Care Planning/Goals of Care: Goals include to maximize quality of life and symptom management. Patient/health care surrogate gave his/her permission to discuss. ?Our advance care planning conversation included a discussion about:    ?The value and importance of advance care planning  ?Experiences with loved ones who have been seriously ill or have died  ?Exploration of personal, cultural or spiritual beliefs that might influence medical decisions  ?CODE STATUS: DNR ? ?Symptom Management/Plan: ? ?Dementia-patient has been stable. reorient/redirect as needed. Staff to assist with adl's as needed. Continue Aricept and Namenda as directed. Monitor for falls/safety; use walker for ambulation. monitor for cognitive and functional declines.  ? ? ?Protein calorie malnutrition Fair appetite; continue nutritional supplement BID in between meals. Routine weights per facility policy.  ? ? ?Follow up Palliative Care Visit: Palliative care will continue to follow for complex medical decision making, advance care planning, and clarification  of goals. Return in 8 weeks or prn. ? ?This visit was coded based on medical decision making (MDM). ? ?PPS: 40% ? ?HOSPICE ELIGIBILITY/DIAGNOSIS: TBD ? ?Chief Complaint: Palliative Medicine follow up visit.  ? ?HISTORY OF PRESENT ILLNESS:  Angelica Lane is a 88 y.o. year old female  with dementia, depression, diabetes, hypertension, hyperlipidemia.  ? ?Patient resides at [facility]. Patient reports doing well. She denies any pain, shortness of breath, nausea, constipation. She endorses a good appetite; although staff reports patient appetite being fair. She ate 50% of breakfast; receiving supplements BID in between meals.  She is sleeping well at night. No falls reported. A 10-point ROS is negative except for the pertinent positives and negatives detailed per the HPI.  ? ?History obtained from review of EMR, discussion with primary team, and interview with family, facility staff/caregiver and/or Ms. Scull.  ?I reviewed available labs, medications, imaging, studies and related documents from the EMR.  Records reviewed and summarized above.  ? ?Physical Exam: ?Weight: 97.6 pounds ?Pulse 70, resp 16, sats 99% on room air ?Constitutional: NAD ?General: frail appearing, thin  ?EYES: anicteric sclera, lids intact, no discharge  ?ENMT: intact hearing, oral mucous membranes moist, dentition intact ?CV: S1S2, RRR, no LE edema ?Pulmonary: LCTA, no increased work of breathing, no cough, room air ?Abdomen: normo-active BS + 4 quadrants, soft and non tender, no ascites ?GU: deferred ?MSK:  moves all extremities, ambulatory ?Skin: warm and dry, no rashes or wounds on visible skin ?Neuro:  no generalized weakness,  ?  Psych: non-anxious affect, A and O x 2, forgetful ?Hem/lymph/immuno: no widespread bruising ? ? ?Thank you for the opportunity to participate in the care of Ms. Salmons.  The palliative care team will continue to follow. Please call our office at [phone] if we can be of additional assistance.   ? ?Ezekiel Slocumb, NP  ? ?COVID-19 PATIENT SCREENING TOOL ?Asked and negative response unless otherwise noted:  ? ?Have you had symptoms of covid, tested positive or been in contact with someone with symptoms/positive test in the past 5-10 days? No ? ?

## 2022-03-09 ENCOUNTER — Ambulatory Visit (INDEPENDENT_AMBULATORY_CARE_PROVIDER_SITE_OTHER): Payer: Medicare Other

## 2022-03-09 DIAGNOSIS — R001 Bradycardia, unspecified: Secondary | ICD-10-CM | POA: Diagnosis not present

## 2022-03-10 LAB — CUP PACEART REMOTE DEVICE CHECK
Battery Remaining Longevity: 138 mo
Battery Remaining Percentage: 93 %
Battery Voltage: 3.05 V
Brady Statistic RV Percent Paced: 1 %
Date Time Interrogation Session: 20230327020014
Implantable Lead Implant Date: 20210924
Implantable Lead Location: 753860
Implantable Pulse Generator Implant Date: 20210924
Lead Channel Impedance Value: 450 Ohm
Lead Channel Pacing Threshold Amplitude: 0.75 V
Lead Channel Pacing Threshold Pulse Width: 0.4 ms
Lead Channel Sensing Intrinsic Amplitude: 9.1 mV
Lead Channel Setting Pacing Amplitude: 2.5 V
Lead Channel Setting Pacing Pulse Width: 0.4 ms
Lead Channel Setting Sensing Sensitivity: 2 mV
Pulse Gen Model: 1272
Pulse Gen Serial Number: 3828073

## 2022-03-16 ENCOUNTER — Encounter (HOSPITAL_COMMUNITY): Payer: Self-pay

## 2022-03-16 ENCOUNTER — Other Ambulatory Visit: Payer: Self-pay

## 2022-03-16 ENCOUNTER — Emergency Department (HOSPITAL_COMMUNITY)
Admission: EM | Admit: 2022-03-16 | Discharge: 2022-03-16 | Disposition: A | Discharge status: Other Acute Inpatient Hospital | Payer: Medicare Other | Attending: Emergency Medicine | Admitting: Emergency Medicine

## 2022-03-16 ENCOUNTER — Emergency Department (HOSPITAL_COMMUNITY): Payer: Medicare Other

## 2022-03-16 DIAGNOSIS — W010XXA Fall on same level from slipping, tripping and stumbling without subsequent striking against object, initial encounter: Secondary | ICD-10-CM | POA: Diagnosis not present

## 2022-03-16 DIAGNOSIS — S0001XA Abrasion of scalp, initial encounter: Secondary | ICD-10-CM

## 2022-03-16 DIAGNOSIS — W19XXXA Unspecified fall, initial encounter: Secondary | ICD-10-CM

## 2022-03-16 DIAGNOSIS — I10 Essential (primary) hypertension: Secondary | ICD-10-CM | POA: Insufficient documentation

## 2022-03-16 DIAGNOSIS — Z7984 Long term (current) use of oral hypoglycemic drugs: Secondary | ICD-10-CM | POA: Insufficient documentation

## 2022-03-16 DIAGNOSIS — Z79899 Other long term (current) drug therapy: Secondary | ICD-10-CM | POA: Diagnosis not present

## 2022-03-16 DIAGNOSIS — S0990XA Unspecified injury of head, initial encounter: Secondary | ICD-10-CM | POA: Diagnosis present

## 2022-03-16 DIAGNOSIS — E119 Type 2 diabetes mellitus without complications: Secondary | ICD-10-CM | POA: Diagnosis not present

## 2022-03-16 LAB — CBC WITH DIFFERENTIAL/PLATELET
Abs Immature Granulocytes: 0 10*3/uL (ref 0.00–0.07)
Basophils Absolute: 0 10*3/uL (ref 0.0–0.1)
Basophils Relative: 1 %
Eosinophils Absolute: 0.2 10*3/uL (ref 0.0–0.5)
Eosinophils Relative: 7 %
HCT: 35.6 % — ABNORMAL LOW (ref 36.0–46.0)
Hemoglobin: 11.8 g/dL — ABNORMAL LOW (ref 12.0–15.0)
Immature Granulocytes: 0 %
Lymphocytes Relative: 31 %
Lymphs Abs: 1 10*3/uL (ref 0.7–4.0)
MCH: 31.5 pg (ref 26.0–34.0)
MCHC: 33.1 g/dL (ref 30.0–36.0)
MCV: 94.9 fL (ref 80.0–100.0)
Monocytes Absolute: 0.3 10*3/uL (ref 0.1–1.0)
Monocytes Relative: 8 %
Neutro Abs: 1.6 10*3/uL — ABNORMAL LOW (ref 1.7–7.7)
Neutrophils Relative %: 53 %
Platelets: 260 10*3/uL (ref 150–400)
RBC: 3.75 MIL/uL — ABNORMAL LOW (ref 3.87–5.11)
RDW: 13.9 % (ref 11.5–15.5)
WBC: 3.1 10*3/uL — ABNORMAL LOW (ref 4.0–10.5)
nRBC: 0 % (ref 0.0–0.2)

## 2022-03-16 LAB — URINALYSIS, ROUTINE W REFLEX MICROSCOPIC
Bilirubin Urine: NEGATIVE
Glucose, UA: NEGATIVE mg/dL
Hgb urine dipstick: NEGATIVE
Ketones, ur: NEGATIVE mg/dL
Leukocytes,Ua: NEGATIVE
Nitrite: NEGATIVE
Protein, ur: NEGATIVE mg/dL
Specific Gravity, Urine: 1.012 (ref 1.005–1.030)
pH: 5 (ref 5.0–8.0)

## 2022-03-16 LAB — COMPREHENSIVE METABOLIC PANEL
ALT: 11 U/L (ref 0–44)
AST: 16 U/L (ref 15–41)
Albumin: 3.9 g/dL (ref 3.5–5.0)
Alkaline Phosphatase: 108 U/L (ref 38–126)
Anion gap: 7 (ref 5–15)
BUN: 13 mg/dL (ref 8–23)
CO2: 23 mmol/L (ref 22–32)
Calcium: 9.8 mg/dL (ref 8.9–10.3)
Chloride: 113 mmol/L — ABNORMAL HIGH (ref 98–111)
Creatinine, Ser: 0.96 mg/dL (ref 0.44–1.00)
GFR, Estimated: 56 mL/min — ABNORMAL LOW (ref >60)
Glucose, Bld: 108 mg/dL — ABNORMAL HIGH (ref 70–99)
Potassium: 3.4 mmol/L — ABNORMAL LOW (ref 3.5–5.1)
Sodium: 143 mmol/L (ref 135–145)
Total Bilirubin: 0.4 mg/dL (ref 0.3–1.2)
Total Protein: 6.8 g/dL (ref 6.5–8.1)

## 2022-03-16 LAB — TROPONIN I (HIGH SENSITIVITY): Troponin I (High Sensitivity): 9 ng/L (ref <18)

## 2022-03-16 MED ORDER — TETANUS-DIPHTH-ACELL PERTUSSIS 5-2.5-18.5 LF-MCG/0.5 IM SUSY
0.5000 mL | PREFILLED_SYRINGE | Freq: Once | INTRAMUSCULAR | Status: DC
  Filled 2022-03-16: qty 0.5

## 2022-03-16 MED ORDER — POTASSIUM CHLORIDE CRYS ER 20 MEQ PO TBCR
20.0000 meq | EXTENDED_RELEASE_TABLET | Freq: Once | ORAL | Status: DC
  Filled 2022-03-16: qty 1

## 2022-03-16 NOTE — ED Triage Notes (Signed)
Present via EMS with c/o of head injury from fall. Denies LOC, states she fell because of slick floor. Arrives awake, alert and oriented x2, clear speech, no facial droop, moves all extremities. ?

## 2022-03-16 NOTE — ED Provider Notes (Addendum)
?[facility] ?Provider Note ? ? ?CSN: [id number] ?Arrival date & time: 03/16/22  1013 ? ?  ? ?History ? ?Chief Complaint  ?Patient presents with  ?? Fall  ?? Head Injury  ?  Pt found on floor at nursing home, c/o pain to left side of head. Denies loc, states floor was slippery causing fall.  ? ? ?Angelica RhodesBetty Lavella LemonsFlorence Lane is a 88 y.o. female. ? ?HPI ?88 year old female with a history of dementia, DM type II, hypertension, symptomatic bradycardia with a pacemaker presents to the ER from our facility after a fall.  Patient reports that she slipped and fell on the floor.  She was found down on the floor by her glute workers.  She denies losing consciousness though did hit her head and has a visible laceration to the posterior scalp.  She denies being on any blood thinners.  She is alert and oriented x2 which is her baseline per the facility.  She denies any chest pain, dizziness, headache, shortness of breath at the time of the fall. ? ?Spoke w/ provider at [facility] who reports no recent viral illness, fevers, uti symptoms.  ?  ? ?Home Medications ?Prior to Admission medications   ?Medication Sig Start Date End Date Taking? Authorizing Provider  ?amLODipine (NORVASC) 10 MG tablet Take 1 tablet (10 mg total) by mouth daily. 09/09/20  Yes Lanae BoastKc, Ramesh, MD  ?carvedilol (COREG) 6.25 MG tablet Take 6.25 mg by mouth 2 (two) times daily with a meal.    Yes [provider]  ?donepezil (ARICEPT) 5 MG tablet Take 5 mg by mouth at bedtime.  08/01/18  Yes [provider]  ?Ensure (ENSURE) Take 237 mLs by mouth 2 (two) times daily between meals.   Yes [provider]  ?escitalopram (LEXAPRO) 5 MG tablet Take 5 mg by mouth daily.   Yes [provider]  ?ezetimibe (ZETIA) 10 MG tablet Take 10 mg by mouth at bedtime.   Yes [provider]  ?ketoconazole (NIZORAL) 2 % cream Apply 1 application topically every other day.   Yes [provider]  ?losartan (COZAAR) 100 MG tablet Take 100 mg by mouth daily.    Yes [provider]  ?memantine (NAMENDA) 5 MG tablet Take 5 mg by mouth 2 (two) times daily.   Yes [provider]  ?metFORMIN (GLUCOPHAGE-XR) 500 MG 24 hr tablet Take 500 mg by mouth in the morning and at bedtime.   Yes [provider]  ?simvastatin (ZOCOR) 10 MG tablet Take 10 mg by mouth at bedtime.   Yes [provider]  ?Skin Protectants, Misc. (EUCERIN) cream Apply 1 application. topically daily. Intensive repair emollient combination,daily prn   Yes [provider]  ?divalproex (DEPAKOTE SPRINKLE) 125 MG capsule divalproex 125 mg capsule,delayed release sprinkle twice a day  10/25/19  [provider]  ?metoprolol succinate (TOPROL-XL) 100 MG 24 hr tablet metoprolol succinate ER 200 mg tablet,extended release 24 hr in the morning  10/25/19  [provider]  ?   ? ?Allergies    ?Patient has no known allergies.   ? ?Review of Systems   ?Review of Systems ?Ten systems reviewed and are negative for acute change, except as noted in the HPI.  ? ?Physical Exam ?Updated Vital Signs ?BP (!) 149/62   Pulse (!) 55   Temp 97.7 ?F (36.5 ?C) (Oral)   Resp 13   Ht 4\' 11"  (1.499 m)   Wt 45.4 kg  SpO2 100%   BMI 20.20 kg/m?  ?Physical Exam ?Vitals and nursing note reviewed.  ?Constitutional:   ?   General: She is not in acute distress. ?   Appearance: She is well-developed.  ?HENT:  ?   Head: Normocephalic and atraumatic.  ?   Comments: Superficial abrasion to the left occipital scalp, bleeding controlled ?Eyes:  ?   Conjunctiva/sclera: Conjunctivae normal.  ?Cardiovascular:  ?   Rate and Rhythm: Normal rate and regular rhythm.  ?   Heart sounds: No murmur heard. ?Pulmonary:  ?   Effort: Pulmonary effort is normal. No respiratory distress.  ?   Breath sounds: Normal breath sounds.  ?Abdominal:  ?   Palpations: Abdomen is soft.  ?   Tenderness: There is no abdominal tenderness.   ?Musculoskeletal:     ?   General: No swelling.  ?   Cervical back: Neck supple.  ?   Comments: No C, T, L-spine tenderness.  5/5 strength in upper and lower extremities.  No noticeable step-offs, crepitus, fluctuance, erythema.  Sensations intact.  Full range of motion and strength of neck. Moving all 4 extremities without difficulty. ? ? ?  ?Skin: ?   General: Skin is warm and dry.  ?   Capillary Refill: Capillary refill takes less than 2 seconds.  ?Neurological:  ?   General: No focal deficit present.  ?   Mental Status: She is alert. Mental status is at baseline.  ?Psychiatric:     ?   Mood and Affect: Mood normal.  ?   Comments: Mental Status:  ?Alert, thought content appropriate,alert and oriented x 2, able to give a coherent history. Speech fluent without evidence of aphasia. Able to follow 2 step commands without difficulty.  ?Cranial Nerves:  ?II:  Peripheral visual fields grossly normal, pupils equal, round, reactive to light ?III,IV, VI: ptosis not present, extra-ocular motions intact bilaterally  ?V,VII: smile symmetric, facial light touch sensation equal ?VIII: hearing grossly normal to voice  ?X: uvula elevates symmetrically  ?XI: bilateral shoulder shrug symmetric and strong ?XII: midline tongue extension without fassiculations ?Motor:  ?Normal tone. 5/5 strength of BUE and BLE major muscle groups including strong and equal grip strength and dorsiflexion/plantar flexion ?Sensory: light touch normal in all extremities. ?Cerebellar: normal finger-to-nose with bilateral upper extremities, Romberg sign absent ?Gait: not accessed  ? ? ?  ? ? ?ED Results / Procedures / Treatments   ?Labs ?(all labs ordered are listed, but only abnormal results are displayed) ?Labs Reviewed  ?CBC WITH DIFFERENTIAL/PLATELET - Abnormal; Notable for the following components:  ?    Result Value  ? WBC 3.1 (*)   ? RBC 3.75 (*)   ? Hemoglobin 11.8 (*)   ? HCT 35.6 (*)   ? Neutro Abs 1.6 (*)   ? All other components within normal  limits  ?COMPREHENSIVE METABOLIC PANEL - Abnormal; Notable for the following components:  ? Potassium 3.4 (*)   ? Chloride 113 (*)   ? Glucose, Bld 108 (*)   ? GFR, Estimated 56 (*)   ? All other components within normal limits  ?URINALYSIS, ROUTINE W REFLEX MICROSCOPIC  ?TROPONIN I (HIGH SENSITIVITY)  ? ? ?EKG ?None ? ?Radiology ?CT Head Wo Contrast ? ?Result Date: 03/16/2022 ?CLINICAL DATA:  88 year old female presenting with neck trauma. EXAM: CT HEAD WITHOUT CONTRAST CT CERVICAL SPINE WITHOUT CONTRAST TECHNIQUE: Multidetector CT imaging of the head and cervical spine was performed following the standard protocol without intravenous contrast. Multiplanar CT image  reconstructions of the cervical spine were also generated. RADIATION DOSE REDUCTION: This exam was performed according to the departmental dose-optimization program which includes automated exposure control, adjustment of the mA and/or kV according to patient size and/or use of iterative reconstruction technique. COMPARISON:  September of 2021. FINDINGS: CT HEAD FINDINGS Brain: No evidence of acute infarction, hemorrhage, hydrocephalus, extra-axial collection or mass lesion/mass effect. Extensive atrophy and signs of chronic microvascular ischemic change without interval change. Vascular: No hyperdense vessel or unexpected calcification. Skull: Normal. Negative for fracture or focal lesion. Sinuses/Orbits: Visualized paranasal sinuses and orbits are unremarkable to the extent evaluated. Other: Soft tissue swelling in the LEFT posterior no underlying calvarial abnormality. Dressing in place over the site. Parietal scalp near the vertex. CT CERVICAL SPINE FINDINGS Alignment: Straightening of normal cervical lordotic curvature, mild reversal of curvature and mild 2 mm anterolisthesis of C3 on C4 and C4 on C5 with similar appearance to prior imaging. Skull base and vertebrae: No acute fracture. No primary bone lesion or focal pathologic process. Soft tissues  and spinal canal: No prevertebral fluid or swelling. No visible canal hematoma. Disc levels: Multilevel facet arthropathy, disc space narrowing and uncovertebral spurring as before. Upper chest: Negative. Other: None IMP

## 2022-03-16 NOTE — ED Notes (Signed)
[facility] Called for transport 

## 2022-03-16 NOTE — ED Notes (Signed)
Attempted report to [facility]en was at [phone], nurse not available. Left message to call here for questions.  ?

## 2022-03-16 NOTE — Discharge Instructions (Signed)
You were evaluated in the Emergency Department and after careful evaluation, we did not find any emergent condition requiring admission or further testing in the hospital. ? ?Please keep your abrasion clean and dry.  ? ?Please return to the Emergency Department if you experience any worsening of your condition.  We encourage you to follow up with a primary care provider.  Thank you for allowing [place] to be a part of your care. ? ?

## 2022-03-19 NOTE — Progress Notes (Signed)
Remote pacemaker transmission.   

## 2022-03-28 ENCOUNTER — Emergency Department (HOSPITAL_COMMUNITY)
Admission: EM | Admit: 2022-03-28 | Discharge: 2022-03-28 | Disposition: A | Discharge status: Skilled Nursing Facility | Payer: Medicare Other | Attending: Emergency Medicine | Admitting: Emergency Medicine

## 2022-03-28 ENCOUNTER — Emergency Department (HOSPITAL_COMMUNITY): Payer: Medicare Other

## 2022-03-28 DIAGNOSIS — F0390 Unspecified dementia without behavioral disturbance: Secondary | ICD-10-CM | POA: Diagnosis not present

## 2022-03-28 DIAGNOSIS — Z23 Encounter for immunization: Secondary | ICD-10-CM | POA: Diagnosis not present

## 2022-03-28 DIAGNOSIS — S0990XA Unspecified injury of head, initial encounter: Secondary | ICD-10-CM | POA: Insufficient documentation

## 2022-03-28 DIAGNOSIS — Z79899 Other long term (current) drug therapy: Secondary | ICD-10-CM | POA: Diagnosis not present

## 2022-03-28 DIAGNOSIS — I10 Essential (primary) hypertension: Secondary | ICD-10-CM | POA: Insufficient documentation

## 2022-03-28 DIAGNOSIS — W06XXXA Fall from bed, initial encounter: Secondary | ICD-10-CM | POA: Diagnosis not present

## 2022-03-28 DIAGNOSIS — W19XXXA Unspecified fall, initial encounter: Secondary | ICD-10-CM

## 2022-03-28 DIAGNOSIS — Z950 Presence of cardiac pacemaker: Secondary | ICD-10-CM | POA: Diagnosis not present

## 2022-03-28 MED ORDER — TETANUS-DIPHTH-ACELL PERTUSSIS 5-2.5-18.5 LF-MCG/0.5 IM SUSY
0.5000 mL | PREFILLED_SYRINGE | Freq: Once | INTRAMUSCULAR | Status: AC
  Administered 2022-03-28: 0.5 mL via INTRAMUSCULAR
  Filled 2022-03-28: qty 0.5

## 2022-03-28 NOTE — Discharge Instructions (Signed)
Angelica Lane was seen in the ER today after a fall.  ?Her head and neck CT did not show a bleed or fractures.  ?The x-rays of the right ankle showed a bone fragment on the outside of the ankle that is likely old, however given that she had pain when pushing on the area we placed her into a brace and provided orthopedics information for follow-up.  Please follow-up with primary care and orthopedics.  Return to the emergency department for new or worsening symptoms or any other concerns. ? ?Tetanus was updated today. ? ? ?

## 2022-03-28 NOTE — ED Notes (Signed)
[facility] Called 

## 2022-03-28 NOTE — ED Provider Notes (Signed)
?[facility] ?Provider Note ? ? ?CSN: [id number] ?Arrival date & time: 03/28/22  0327 ? ?  ? ?History ? ?Chief Complaint  ?Patient presents with  ? Fall  ?  No thinners  ? ? ?Angelica Lane is a 88 y.o. female with a history of hypertension, hypercholesterolemia, dementia, and pacemaker who presents to the ED via EMS for evaluation S/p fall. Patient states she was trying to get up out of bed, slipped, and fell. She reports she hit her head but did not have LOC. Only has pain to the head, no other alleviating/aggravating factors. Denies chest pain, abdominal pain, or back pain.  ? ?Per EMS patient is not on blood thinners, coming from [facility].  ? ?HPI ? ?  ? ?Home Medications ?Prior to Admission medications   ?Medication Sig Start Date End Date Taking? Authorizing Provider  ?amLODipine (NORVASC) 10 MG tablet Take 1 tablet (10 mg total) by mouth daily. 09/09/20  Yes Antonieta Pert, MD  ?carvedilol (COREG) 6.25 MG tablet Take 6.25 mg by mouth 2 (two) times daily with a meal.    Yes [provider]  ?donepezil (ARICEPT) 5 MG tablet Take 5 mg by mouth at bedtime.  08/01/18  Yes [provider]  ?escitalopram (LEXAPRO) 5 MG tablet Take 5 mg by mouth daily.   Yes [provider]  ?ezetimibe (ZETIA) 10 MG tablet Take 10 mg by mouth at bedtime.   Yes [provider]  ?ketoconazole (NIZORAL) 2 % cream Apply 1 application. topically every other day. To dry skin areas   Yes [provider]  ?losartan (COZAAR) 100 MG tablet Take 100 mg by mouth daily.    Yes [provider]  ?memantine (NAMENDA) 5 MG tablet Take 5 mg by mouth 2 (two) times daily.   Yes [provider]  ?metFORMIN (GLUCOPHAGE-XR) 500 MG 24 hr tablet Take 500 mg by mouth in the morning and at bedtime.   Yes [provider]  ?NON FORMULARY Take by mouth See admin instructions. House Supplement 1 carton twice daily between meals   Yes [provider]  ?simvastatin (ZOCOR) 10 MG tablet Take 10 mg by mouth at bedtime.   Yes [provider]  ?Skin Protectants, Misc. (EUCERIN) cream Apply 1 application. topically daily. After bath or wash up   Yes [provider]  ?Ensure (ENSURE) Take 237 mLs by mouth 2 (two) times daily between meals. ?Patient not taking: Reported on 03/28/2022    [provider]  ?divalproex (DEPAKOTE SPRINKLE) 125 MG capsule divalproex 125 mg capsule,delayed release sprinkle twice a day  10/25/19  [provider]  ?metoprolol succinate (TOPROL-XL) 100 MG 24 hr tablet metoprolol succinate ER 200 mg tablet,extended release 24 hr in the morning  10/25/19  [provider]  ?   ? ?Allergies    ?Patient has no known allergies.   ? ?Review of Systems   ?Review of Systems  ?Constitutional:  Negative for chills and fever.  ?Respiratory:  Negative for shortness of breath.   ?Cardiovascular:  Negative for chest pain.  ?Gastrointestinal:  Negative for abdominal pain and vomiting.  ?Musculoskeletal:  Negative for back pain.  ?Skin:  Positive for wound.  ?Neurological:  Positive for headaches. Negative for syncope.  ?All other systems reviewed and are negative. ? ?Physical Exam ?Updated Vital Signs ?BP (!) 149/78 (BP Location: Left Arm)   Pulse 60   Temp 97.9 ?F (36.6 ?C) (Oral)   SpO2 100%  ?  Physical Exam ?Vitals and nursing note reviewed.  ?Constitutional:   ?   General: She is not in acute distress. ?   Appearance: She is well-developed.  ?HENT:  ?   Head: No raccoon eyes or Battle's sign.  ? ?   Right Ear: No hemotympanum.  ?   Left Ear: No hemotympanum.  ?Eyes:  ?   General:     ?   Right eye: No discharge.     ?   Left eye: No discharge.  ?   Extraocular Movements: Extraocular movements intact.  ?   Conjunctiva/sclera: Conjunctivae normal.  ?   Pupils: Pupils are equal, round, and reactive to light.  ?Neck:  ?   Comments: No point/focal vertebral tenderness.  ?Cardiovascular:  ?   Rate and  Rhythm: Normal rate and regular rhythm.  ?   Pulses:     ?     Dorsalis pedis pulses are 2+ on the right side and 2+ on the left side.  ?   Heart sounds: No murmur heard. ?Pulmonary:  ?   Effort: No respiratory distress.  ?   Breath sounds: Normal breath sounds. No wheezing or rales.  ?Chest:  ?   Chest wall: No tenderness.  ?Abdominal:  ?   General: There is no distension.  ?   Palpations: Abdomen is soft.  ?   Tenderness: There is no abdominal tenderness.  ?Musculoskeletal:  ?   Cervical back: Normal range of motion and neck supple.  ?   Comments: Ues/Les: no obvious deformities.  Patient has intact active range of motion throughout the bilateral shoulders, elbows, wrist, and all digits.  She also has intact active range of motion throughout the bilateral hips, knees, ankles, and all digits.  Able to lift her leg off of the bed without difficulty and fully flex and extend the knees.  Tender to palpation to the right ankle and lower leg anteriorly.  Otherwise nontender.  Compartments are soft. ?Back: No point/focal vertebral tenderness or step off.   ?Skin: ?   General: Skin is warm and dry.  ?   Findings: No rash.  ?Neurological:  ?   Comments: Alert.  Clear speech.  Sensation grossly tact bilateral upper and lower extremities.  5 out of 5 symmetric grip strength and strength with plantar dorsiflexion bilaterally.  ?Psychiatric:     ?   Behavior: Behavior normal.  ? ? ?ED Results / Procedures / Treatments   ?Labs ?(all labs ordered are listed, but only abnormal results are displayed) ?Labs Reviewed - No data to display ? ?EKG ?None ? ?Radiology ?DG Tibia/Fibula Right ? ?Result Date: 03/28/2022 ?CLINICAL DATA:  88 year old female status post fall from bed. Pain. EXAM: RIGHT TIBIA AND FIBULA - 2 VIEW COMPARISON:  Right ankle series today. Right knee series 08/21/2020. FINDINGS: Stable appearance of the right ankle as detailed separately. Alignment at the left knee appears stable since 2021, with bulky  tricompartmental knee joint degeneration. Distal femur and patella appear grossly intact. Proximal and mid right tibia and fibula appear intact. IMPRESSION: 1. Right ankle detailed separately. 2. Otherwise no acute fracture or dislocation identified about the right tib-fib. 3. Severe tricompartmental chronic knee degeneration. Electronically Signed   By: Genevie Ann M.D.   On: 03/28/2022 04:58  ? ?DG Ankle Complete Right ? ?Result Date: 03/28/2022 ?CLINICAL DATA:  88 year old female status post fall from bed.  Pain. EXAM: RIGHT ANKLE - COMPLETE 3+ VIEW COMPARISON:  None. FINDINGS: Maintained mortise joint alignment. Chronic  ossific fragments at the medial malleolus. Distal fibula appears intact. Acute versus chronic crescent-shaped bone fragment at the posterior malleolus on the lateral view. This fragment is about 14 mm (arrow). No evidence of ankle joint effusion. Calcaneus appears intact with prominent degenerative spurring. Osteopenia in the foot. Lateral greater than medial soft tissue swelling. No other No acute osseous abnormality identified. IMPRESSION: 1. Age indeterminate but probably chronic bone fragment of the posterior malleolus. Query point tenderness. There are chronic fragments at the medial malleolus. 2. No joint effusion or other acute fracture/dislocation at the right ankle. Electronically Signed   By: Genevie Ann M.D.   On: 03/28/2022 04:57  ? ?CT Head Wo Contrast ? ?Result Date: 03/28/2022 ?CLINICAL DATA:  88 year old female status post fall from bed. Posterior head laceration with bleeding controlled. EXAM: CT HEAD WITHOUT CONTRAST TECHNIQUE: Contiguous axial images were obtained from the base of the skull through the vertex without intravenous contrast. RADIATION DOSE REDUCTION: This exam was performed according to the departmental dose-optimization program which includes automated exposure control, adjustment of the mA and/or kV according to patient size and/or use of iterative reconstruction  technique. COMPARISON:  Brain MRI 03/28/2018.  Head CT 03/16/2022. FINDINGS: Brain: Stable cerebral volume. No midline shift, ventriculomegaly, mass effect, evidence of mass lesion, intracranial hemorrhage or evidence of cortically bas

## 2022-03-28 NOTE — ED Triage Notes (Signed)
BIB [facility] from [facility]. Fell from bed. Lac to back left head, bleeding controlled. Confused at baseline A+Ox2. Grips equal, no facial droop, full ROM in all extremities. Pupils equal and pinpoint. Only complains of head pain upon arrival to ED.  ? ?EMS vitals ?120/60 ?64HR ?153 CBG ?

## 2022-05-21 ENCOUNTER — Non-Acute Institutional Stay: Payer: Medicare Other | Admitting: Student

## 2022-05-21 DIAGNOSIS — Z515 Encounter for palliative care: Secondary | ICD-10-CM

## 2022-05-21 DIAGNOSIS — E46 Unspecified protein-calorie malnutrition: Secondary | ICD-10-CM

## 2022-05-21 DIAGNOSIS — F0390 Unspecified dementia without behavioral disturbance: Secondary | ICD-10-CM

## 2022-05-21 NOTE — Progress Notes (Signed)
[facility] Consult Note Telephone: [phone]  Fax: [phone]    Date of encounter: 05/21/22 3:52 PM PATIENT NAME: Angelica Lane [address] Angelica Lane [address]   [phone] (home)  DOB: August 08, 1928 MRN: [id number] PRIMARY CARE PROVIDER:    Audley Hose, MD,  [place] [address] [phone]  REFERRING PROVIDER:   Audley Hose, MD [address],  [place] [address] [phone]  RESPONSIBLE PARTY:    Contact Information     Name Relation Home Work [place] Relative [phone]  [phone]   Adelayde, Minney [phone]  [phone]        I met face to face with patient  in the facility. Palliative Care was asked to follow this patient by consultation request of  Audley Hose, MD to address advance care planning and complex medical decision making. This is a follow up visit.                                   ASSESSMENT AND PLAN / RECOMMENDATIONS:   Advance Care Planning/Goals of Care: Goals include to maximize quality of life and symptom management. Patient/health care surrogate gave his/her permission to discuss. CODE STATUS: DNR  Symptom Management/Plan:  Dementia-patient with two falls in past two months; see in ED due to head strikes. No acute intracranial abnormalities. Reorient/redirect as needed. Staff to assist with adl's as needed. Continue Aricept and Namenda as directed. Monitor for falls/safety; use walker for ambulation. Monitor for cognitive and functional declines.   Protein calorie malnutrition- continue nutritional supplements BID in between meals. Routine weights per facility. Monitor for weight loss. Albumin 3.9 03/16/22.  Follow up Palliative Care Visit: Palliative care will continue to follow for complex medical decision making, advance care planning, and clarification of goals.  Return in 8 weeks or prn.   This visit was coded based on medical decision making (MDM).  PPS: 40%  HOSPICE ELIGIBILITY/DIAGNOSIS: TBD  Chief Complaint: Palliative Medicine follow up visit.   HISTORY OF PRESENT ILLNESS:  Angelica Lane is a 88 y.o. year old female  with dementia, T2DM, hypertension, depression, hypertension, hyperlipidemia.  Patient resides at [facility]. Patient received sitting in recliner. She does endorse having a fall recently, where she states she slipped. Patient was seen in ED on 4/3 and 03/28/22 due to falls. She did hit her head, but no loss of consciousness. CT scans with no acute intracranial abnormalities, no acute fractures of cervical spine. She denies pain, shortness of breath, nausea or constipation. She endorses a good appetite, also snacks throughout the day. Her blood sugars range from 70's-120's mg/dL. She is sleeping well. A 10-point ROS is negative except for the pertinent positives and negatives detailed per the HPI.   History obtained from review of EMR, discussion with primary team, and interview with family, facility staff/caregiver and/or Ms. Jupin.  I reviewed available labs, medications, imaging, studies and related documents from the EMR.  Records reviewed and summarized above.   Physical Exam: Weight: 99 pounds Pulse 68, resp 16, sats 98% on room air Constitutional: NAD General: frail appearing, thin EYES: anicteric sclera, lids intact, no discharge  ENMT: intact hearing, oral mucous membranes moist, dentition intact CV: S1S2, RRR, no LE edema Pulmonary: LCTA, no increased work of breathing, no cough, room air Abdomen: normo-active  BS + 4 quadrants, soft and non tender GU: deferred MSK: sarcopenia, moves all extremities, ambulatory Skin: warm and dry, no rashes or wounds on visible skin Neuro:  no generalized weakness,  A & O x 2, forgetful Psych: non-anxious affect, pleasant Hem/lymph/immuno: no widespread  bruising   Thank you for the opportunity to participate in the care of Angelica Lane.  The palliative care team will continue to follow. Please call our office at [phone] if we can be of additional assistance.   Ezekiel Slocumb, NP   COVID-19 PATIENT SCREENING TOOL Asked and negative response unless otherwise noted:   Have you had symptoms of covid, tested positive or been in contact with someone with symptoms/positive test in the past 5-10 days? No

## 2022-06-08 ENCOUNTER — Ambulatory Visit (INDEPENDENT_AMBULATORY_CARE_PROVIDER_SITE_OTHER): Payer: Medicare Other

## 2022-06-08 DIAGNOSIS — R001 Bradycardia, unspecified: Secondary | ICD-10-CM

## 2022-06-08 LAB — CUP PACEART REMOTE DEVICE CHECK
Battery Remaining Longevity: 136 mo
Battery Remaining Percentage: 92 %
Battery Voltage: 3.05 V
Brady Statistic RV Percent Paced: 1 %
Date Time Interrogation Session: 20230624020023
Implantable Lead Implant Date: 20210924
Implantable Lead Location: 753860
Implantable Pulse Generator Implant Date: 20210924
Lead Channel Impedance Value: 440 Ohm
Lead Channel Pacing Threshold Amplitude: 0.75 V
Lead Channel Pacing Threshold Pulse Width: 0.4 ms
Lead Channel Sensing Intrinsic Amplitude: 9.9 mV
Lead Channel Setting Pacing Amplitude: 2.5 V
Lead Channel Setting Pacing Pulse Width: 0.4 ms
Lead Channel Setting Sensing Sensitivity: 2 mV
Pulse Gen Model: 1272
Pulse Gen Serial Number: 3828073

## 2022-07-02 NOTE — Progress Notes (Signed)
Remote pacemaker transmission.   

## 2022-09-07 ENCOUNTER — Ambulatory Visit (INDEPENDENT_AMBULATORY_CARE_PROVIDER_SITE_OTHER): Payer: Medicare Other

## 2022-09-07 DIAGNOSIS — R001 Bradycardia, unspecified: Secondary | ICD-10-CM | POA: Diagnosis not present

## 2022-09-08 LAB — CUP PACEART REMOTE DEVICE CHECK
Battery Remaining Longevity: 133 mo
Battery Remaining Percentage: 91 %
Battery Voltage: 3.05 V
Brady Statistic RV Percent Paced: 1 %
Date Time Interrogation Session: 20230925020013
Implantable Lead Implant Date: 20210924
Implantable Lead Location: 753860
Implantable Pulse Generator Implant Date: 20210924
Lead Channel Impedance Value: 440 Ohm
Lead Channel Pacing Threshold Amplitude: 0.75 V
Lead Channel Pacing Threshold Pulse Width: 0.4 ms
Lead Channel Sensing Intrinsic Amplitude: 10.1 mV
Lead Channel Setting Pacing Amplitude: 2.5 V
Lead Channel Setting Pacing Pulse Width: 0.4 ms
Lead Channel Setting Sensing Sensitivity: 2 mV
Pulse Gen Model: 1272
Pulse Gen Serial Number: 3828073

## 2022-09-17 NOTE — Progress Notes (Signed)
Remote pacemaker transmission.   

## 2022-09-25 ENCOUNTER — Non-Acute Institutional Stay: Payer: Medicare Other | Admitting: Student

## 2022-09-25 DIAGNOSIS — F0390 Unspecified dementia without behavioral disturbance: Secondary | ICD-10-CM

## 2022-09-25 DIAGNOSIS — E46 Unspecified protein-calorie malnutrition: Secondary | ICD-10-CM

## 2022-09-25 DIAGNOSIS — Z515 Encounter for palliative care: Secondary | ICD-10-CM

## 2022-09-27 NOTE — Progress Notes (Signed)
[facility] Consult Note Telephone: [phone]  Fax: [phone]    Date of encounter: 09/25/2022 3:20 PM PATIENT NAME: Angelica Lane [address] Angelica Lane [address]   [phone] (home)  DOB: 07-Nov-1928 MRN: [id number] PRIMARY CARE PROVIDER:    Audley Hose, MD,  [place] [address] [phone]  REFERRING PROVIDER:   Audley Hose, MD [address] Angelica [address] [place],  [place] [address] [phone]  RESPONSIBLE PARTY:    Contact Information     Name Relation Home Work [place] Relative [phone]  [phone]   Angelica Lane [phone]  [phone]        I met face to face with patient  in the facility. Palliative Care was asked to follow this patient by consultation request of  Angelica Hose, MD to address advance care planning and complex medical decision making. This is a follow up visit.                                   ASSESSMENT AND PLAN / RECOMMENDATIONS:   Advance Care Planning/Goals of Care: Goals include to maximize quality of life and symptom management. Patient/health care surrogate gave his/her permission to discuss. CODE STATUS: DNR  Symptom Management/Plan:  Dementia-reorient and redirect as needed. Patient moved to a new room. Staff to continue assisting with adl's. No recent falls reported. Monitor for falls/safety. Continue Aricept and Namenda as directed.   Protein calorie malnutrition- continue nutritional supplements BID in between meals. Weight has been stable. Routine weights per facility. Monitor for weight loss.  Follow up Palliative Care Visit: Palliative care will continue to follow for complex medical decision making, advance care planning, and clarification of goals. Return in 8 weeks or prn.   This visit was coded based on medical decision making (MDM).  PPS: 40%  HOSPICE  ELIGIBILITY/DIAGNOSIS: TBD  Chief Complaint: Palliative Medicine follow up visit.   HISTORY OF PRESENT ILLNESS:  Angelica Lane is a 88 y.o. year old female  with dementia, T2DM, hypertension, depression, hypertension, hyperlipidemia.   Patient resides at [facility]. Patient has moved rooms; she is now in a private room. She reports doing well. No recent falls or injury reported. She endorses a good appetite; weight has been stable. She denies pain, shortness of breath, nausea or constipation. No recent infections, ED visits or hospitalizations. No cognitive or functional declines reported per staff.  History obtained from review of EMR, discussion with primary team, and interview with family, facility staff/caregiver and/or Angelica Lane.  I reviewed available labs, medications, imaging, studies and related documents from the EMR.  Records reviewed and summarized above.   ROS  10-point ROS is negative except for the pertinent positives and negatives detailed per the HPI.   Physical Exam: Weight:100 pounds Pulse 72, resp 16, b/p 130/68, sats 98% on room air Constitutional: NAD General: frail appearing, thin EYES: anicteric sclera, lids intact, no discharge  ENMT: intact hearing, oral mucous membranes moist CV: S1S2, RRR, no LE edema Pulmonary: LCTA, no increased work of breathing, no cough, room air Abdomen:normo-active BS + 4 quadrants, soft and non tender, no ascites GU: deferred MSK: moves all extremities, ambulatory Skin: warm and dry, no rashes or wounds on visible skin Neuro: + generalized weakness,  + cognitive impairment Psych: non-anxious affect, A and O x 2,  forgetful Hem/lymph/immuno: no widespread bruising   Thank you for the opportunity to participate in the care of Angelica Lane.  The palliative care team will continue to follow. Please call our office at [phone] if we can be of additional assistance.   Angelica Slocumb, NP   COVID-19 PATIENT SCREENING  TOOL Asked and negative response unless otherwise noted:   Have you had symptoms of covid, tested positive or been in contact with someone with symptoms/positive test in the past 5-10 days? No

## 2022-12-01 ENCOUNTER — Non-Acute Institutional Stay: Payer: Medicare Other | Admitting: Hospice

## 2022-12-01 DIAGNOSIS — F03911 Unspecified dementia, unspecified severity, with agitation: Secondary | ICD-10-CM

## 2022-12-01 DIAGNOSIS — Z515 Encounter for palliative care: Secondary | ICD-10-CM

## 2022-12-01 DIAGNOSIS — E46 Unspecified protein-calorie malnutrition: Secondary | ICD-10-CM

## 2022-12-01 DIAGNOSIS — F03918 Unspecified dementia, unspecified severity, with other behavioral disturbance: Secondary | ICD-10-CM

## 2022-12-01 NOTE — Progress Notes (Signed)
    [facility] Consult Note Telephone: [phone]  Fax: [phone]     PATIENT NAME: Angelica Lane [address] [name] [address]   [phone] (home)  DOB: 07-19-1983 MRN: [id number] PRIMARY CARE PROVIDER:    Audley Hose, MD,  [place] [address] [phone]  REFERRING PROVIDER:   Audley Hose, MD [address] [name],  [name] [address] [phone]  RESPONSIBLE PARTY:    Contact Information     Name Relation Home Work [place] Relative [phone]  [phone]   Angelica Lane, Angelica Lane [phone]  [phone]        I met face to face with patient  in the facility. Palliative Care was asked to follow this patient by consultation request of  Audley Hose, MD to address advance care planning and complex medical decision making. This is a follow up visit.                                   ASSESSMENT AND PLAN / RECOMMENDATIONS:   Advance Care Planning/Goals of Care: Goals include to maximize quality of life and symptom management. Patient/health care surrogate gave his/her permission to discuss. CODE STATUS: DNR  Symptom Management/Plan:  Dementia- Memory loss/confusion, impoverished thought, requiring assistance in ADLs. Incontinent of bowel and bladder FAST 6D. Continue Aricept and Namenda as directed. Continue ongoing supportive care.  Agitation: Managed with Escitalopram, olanzapine. Follow up with Psych as planned/needed.  Protein calorie malnutrition- height 4 feet 11 inches, current weight 99.2 in December 2023 down from 114 pounds in November 2021.  Continue nutritional supplements BID in between meals.  Offer assistance during meals to ensure adequate oral intake.  Routine weights per facility. Monitor for weight loss.  Follow up Palliative Care Visit: Palliative care will continue to follow for complex  medical decision making, advance care planning, and clarification of goals. Return in 8 weeks or prn.   PPS: 40%  HOSPICE ELIGIBILITY/DIAGNOSIS: TBD  Chief Complaint: Palliative Medicine follow up visit.   HISTORY OF PRESENT ILLNESS:  Angelica Lane is a 88 y.o. year old female  with dementia, T2DM, hypertension, depression, hypertension, hyperlipidemia. History obtained from review of EMR, discussion with primary team, and interview with family, facility staff/caregiver and/or Angelica Lane.  I reviewed available labs, medications, imaging, studies and related documents from the EMR.  Records reviewed and summarized above.   I spent 40 minutes providing this consultation; time includes spent with patient/family, chart review and documentation. More than 50% of the time in this consultation was spent on care coordination.  Thank you for the opportunity to participate in the care of Angelica Lane.  The palliative care team will continue to follow. Please call our office at [phone] if we can be of additional assistance.   Teodoro Spray, NP

## 2022-12-08 ENCOUNTER — Ambulatory Visit (INDEPENDENT_AMBULATORY_CARE_PROVIDER_SITE_OTHER): Payer: Medicare Other

## 2022-12-08 DIAGNOSIS — R001 Bradycardia, unspecified: Secondary | ICD-10-CM

## 2022-12-09 LAB — CUP PACEART REMOTE DEVICE CHECK
Battery Remaining Longevity: 133 mo
Battery Remaining Percentage: 89 %
Battery Voltage: 3.05 V
Brady Statistic RV Percent Paced: 1 %
Date Time Interrogation Session: 20231226020014
Implantable Lead Connection Status: 753985
Implantable Lead Implant Date: 20210924
Implantable Lead Location: 753860
Implantable Pulse Generator Implant Date: 20210924
Lead Channel Impedance Value: 510 Ohm
Lead Channel Pacing Threshold Amplitude: 0.75 V
Lead Channel Pacing Threshold Pulse Width: 0.4 ms
Lead Channel Sensing Intrinsic Amplitude: 12 mV
Lead Channel Setting Pacing Amplitude: 2.5 V
Lead Channel Setting Pacing Pulse Width: 0.4 ms
Lead Channel Setting Sensing Sensitivity: 2 mV
Pulse Gen Model: 1272
Pulse Gen Serial Number: 3828073

## 2022-12-16 ENCOUNTER — Telehealth: Payer: Self-pay

## 2022-12-16 NOTE — Telephone Encounter (Addendum)
Device alert for  HVR, irregular R-R, HR 191 , duration 37min 24sec, Coreg Pt. under paliative care, route to triage for rate and long duration.  Repeat non-actionable alerts.   LA  Patient has on going history of HVR lasting 1 - 48min episodes. With heart rates in the 190's Also appear patient has been more active as of recent as well.  Dr. Lovena Le is aware and has been monitoring this.(As per 06/06/22 and 12/08/22 remote checks)  Patient in a facility and under ongoing palliative care. She is due to see Dr. Lovena Le this month. Will forward to [facility] to get patient in for appt this month. Appt with either Jonni Sanger or Renee okay as well.   I have left message for son, [name] to coordinate appointment for his mother.  Also, to ask how she is doing and if any symptoms.

## 2022-12-23 NOTE — Telephone Encounter (Signed)
Left message for Pt's son [name] requesting call back.  Pt is under palliative care.  Pt has a history of tachycardia.  Need to determine if Pt is able to leave her facility.  If not, would anticipate continuing to monitor remotely.

## 2022-12-25 NOTE — Telephone Encounter (Signed)
Patient resides as [place], patient has apt with [facility] 03/11/22

## 2022-12-31 NOTE — Progress Notes (Signed)
Remote pacemaker transmission.   

## 2023-01-15 ENCOUNTER — Non-Acute Institutional Stay: Payer: Medicare Other | Admitting: Hospice

## 2023-01-15 DIAGNOSIS — E46 Unspecified protein-calorie malnutrition: Secondary | ICD-10-CM

## 2023-01-15 DIAGNOSIS — Z515 Encounter for palliative care: Secondary | ICD-10-CM

## 2023-01-15 DIAGNOSIS — F03911 Unspecified dementia, unspecified severity, with agitation: Secondary | ICD-10-CM

## 2023-01-15 DIAGNOSIS — F03918 Unspecified dementia, unspecified severity, with other behavioral disturbance: Secondary | ICD-10-CM

## 2023-01-15 NOTE — Progress Notes (Signed)
    [facility] Consult Note Telephone: [phone]  Fax: [phone]     PATIENT NAME: Angelica Lane [address]   [phone] (home)  DOB: 24-May-1928 MRN: [id number] PRIMARY CARE PROVIDER:    Audley Hose, MD,  [place] [address] [phone]  REFERRING PROVIDER:   Audley Hose, MD [address] [name],  [place] [address] [phone]  RESPONSIBLE PARTY:    Contact Information     Name Relation Home Work [place] Relative [phone]  [phone]   Estera, Ozier [phone]  [phone]        I met face to face with patient  in the facility. Palliative Care was asked to follow this patient by consultation request of  Audley Hose, MD to address advance care planning and complex medical decision making. This is a follow up visit.                                   ASSESSMENT AND PLAN / RECOMMENDATIONS:   Advance Care Planning/Goals of Care: Goals include to maximize quality of life and symptom management. Patient/health care surrogate gave his/her permission to discuss. CODE STATUS: DNR  Symptom Management/Plan:  Dementia-patient continues with progressive memory loss/confusion, impoverished thought, requiring assistance in ADLs in line with dementia disease trajectory.. Incontinent of bowel and bladder FAST 6D. Continue Aricept and Namenda as directed. Continue ongoing supportive care.  Fall/safety precautions Agitation: Followed by psych.  Currently on Escitalopram; olanzapine was recently discontinued.  Follow up with Psych as planned/needed.   Protein calorie malnutrition-ongoing weight loss.  Current weight is 95 pounds down from 99.2 pounds December 2023, height 4 feet 11 inches. patient was 114 pounds in November 2021.  Continue nutritional house supplements BID in between meals.   Offer assistance during meals to ensure adequate oral intake.  Routine weights per facility. Monitor for weight loss.  Routine CBC CMP.  Follow up Palliative Care Visit: Palliative care will continue to follow for complex medical decision making, advance care planning, and clarification of goals. Return in 8 weeks or prn.   PPS: 40%  HOSPICE ELIGIBILITY/DIAGNOSIS: TBD  Chief Complaint: Palliative Medicine follow up visit.   HISTORY OF PRESENT ILLNESS:  Angelica Lane is a 89 y.o. year old female  with dementia, T2DM, hypertension, depression, hypertension, hyperlipidemia. History obtained from review of EMR, discussion with primary team, and interview with family, facility staff/caregiver and/or Ms. Brandstetter.  I reviewed available labs, medications, imaging, studies and related documents from the EMR.  Records reviewed and summarized above.   I spent 40 minutes providing this consultation; time includes spent with patient/family, chart review and documentation. More than 50% of the time in this consultation was spent on care coordination.  Thank you for the opportunity to participate in the care of Angelica Lane.  The palliative care team will continue to follow. Please call our office at [phone] if we can be of additional assistance.   Teodoro Spray, NP

## 2023-02-25 ENCOUNTER — Non-Acute Institutional Stay: Payer: Medicare Other | Admitting: Hospice

## 2023-02-25 DIAGNOSIS — Z515 Encounter for palliative care: Secondary | ICD-10-CM

## 2023-02-25 DIAGNOSIS — F03918 Unspecified dementia, unspecified severity, with other behavioral disturbance: Secondary | ICD-10-CM

## 2023-02-25 DIAGNOSIS — R634 Abnormal weight loss: Secondary | ICD-10-CM

## 2023-02-25 DIAGNOSIS — F03911 Unspecified dementia, unspecified severity, with agitation: Secondary | ICD-10-CM

## 2023-02-25 NOTE — Progress Notes (Signed)
    [facility] Palliative Care Consult Note Telephone: [phone]  Fax: [phone]     PATIENT NAME: [place] [address]   [phone] (home)  DOB: [birth year] MRN: [id number] PRIMARY CARE PROVIDER:    Audley Hose, MD,  [place] [address] [phone]  REFERRING PROVIDER:   Audley Hose, MD [name] [place],  [place] [address] [phone]  RESPONSIBLE PARTY:    Contact Information     Name Relation Home Work [place] Relative [phone]  [phone]   [place], Dosch [phone]  [phone]        I met face to face with patient  in the facility. Palliative Care was asked to follow this patient by consultation request of  Audley Hose, MD to address advance care planning and complex medical decision making. This is a follow up visit.                                   ASSESSMENT AND PLAN / RECOMMENDATIONS:   Advance Care Planning/Goals of Care: Goals include to maximize quality of life and symptom management.  CODE STATUS: DNR  Symptom Management/Plan:  Dementia: Memory loss/confusion at baseline. FAST 6 D.  Continue Aricept and Namenda as directed. Continue ongoing supportive care.  Fall/safety precautions.  Agitation: Staff reports more cooperative with care. Continue.  Escitalopram. Follow up with Psych as planned/needed.  Abnormal weight loss:  Current weight is 96.2 Ibs, a slight improvement from 95 pounds last month, overall  down from 99.2 pounds December 2023, height 4 feet 11 inches. Continue nutritional house supplements BID in between meals.  Offer assistance during meals to ensure adequate oral intake.  Routine weights per facility.   Follow up Palliative Care Visit: Palliative care will continue to follow for complex medical decision making, advance care planning, and  clarification of goals. Return in 8 weeks or prn.   PPS: 40%  HOSPICE ELIGIBILITY/DIAGNOSIS: TBD  Chief Complaint: Palliative Medicine follow up visit.   HISTORY OF PRESENT ILLNESS:  Sherrill Buikema is a 89 y.o. year old female  with dementia, T2DM, hypertension, depression, hypertension, hyperlipidemia, Xerosis.  Patient in no acute distress, denies pain/discomfort.  No hospitalization since last visit. History obtained from review of EMR, discussion with primary team, and interview with family, facility staff/caregiver and/or Ms. Lineberry.  I reviewed available labs, medications, imaging, studies and related documents from the EMR.  Records reviewed and summarized above.   I spent 35 minutes providing this consultation; time includes spent with patient/family, chart review and documentation. More than 50% of the time in this consultation was spent on care coordination.  Thank you for the opportunity to participate in the care of Ms. Stellmach.  The palliative care team will continue to follow. Please call our office at [phone] if we can be of additional assistance.   Teodoro Spray, NP

## 2023-03-12 ENCOUNTER — Ambulatory Visit: Payer: Medicare Other | Attending: Internal Medicine | Admitting: Internal Medicine

## 2023-03-12 ENCOUNTER — Encounter: Payer: Self-pay | Admitting: Internal Medicine

## 2023-03-12 VITALS — BP 122/66 | HR 55 | Ht 59.0 in | Wt 93.0 lb

## 2023-03-12 DIAGNOSIS — I495 Sick sinus syndrome: Secondary | ICD-10-CM | POA: Diagnosis not present

## 2023-03-12 DIAGNOSIS — I10 Essential (primary) hypertension: Secondary | ICD-10-CM

## 2023-03-12 DIAGNOSIS — Z950 Presence of cardiac pacemaker: Secondary | ICD-10-CM | POA: Diagnosis not present

## 2023-03-12 LAB — CUP PACEART INCLINIC DEVICE CHECK
Battery Remaining Longevity: 129 mo
Battery Voltage: 3.05 V
Brady Statistic RV Percent Paced: 0.11 %
Date Time Interrogation Session: 20240329161356
Implantable Lead Connection Status: 753985
Implantable Lead Implant Date: 20210924
Implantable Lead Location: 753860
Implantable Pulse Generator Implant Date: 20210924
Lead Channel Impedance Value: 487.5 Ohm
Lead Channel Pacing Threshold Amplitude: 0.75 V
Lead Channel Pacing Threshold Amplitude: 0.75 V
Lead Channel Pacing Threshold Pulse Width: 0.4 ms
Lead Channel Pacing Threshold Pulse Width: 0.4 ms
Lead Channel Sensing Intrinsic Amplitude: 10.9 mV
Lead Channel Setting Pacing Amplitude: 2.5 V
Lead Channel Setting Pacing Pulse Width: 0.4 ms
Lead Channel Setting Sensing Sensitivity: 2 mV
Pulse Gen Model: 1272
Pulse Gen Serial Number: 3828073

## 2023-03-12 NOTE — Patient Instructions (Signed)
Medication Instructions:  Your physician recommends that you continue on your current medications as directed. Please refer to the Current Medication list given to you today.  *If you need a refill on your cardiac medications before your next appointment, please call your pharmacy*  Lab Work: None ordered.  If you have labs (blood work) drawn today and your tests are completely normal, you will receive your results only by: Angelica Lane (if you have MyChart) OR A paper copy in the mail If you have any lab test that is abnormal or we need to change your treatment, we will call you to review the results.  Testing/Procedures: None ordered.  Follow-Up: At [facility], you and your health needs are our priority.  As part of our continuing mission to provide you with exceptional heart care, we have created designated Provider Care Teams.  These Care Teams include your primary Cardiologist (physician) and Advanced Practice Providers (APPs -  Physician Assistants and Nurse Practitioners) who all work together to provide you with the care you need, when you need it.  We recommend signing up for the patient portal called "MyChart".  Sign up information is provided on this After Visit Summary.  MyChart is used to connect with patients for Virtual Visits (Telemedicine).  Patients are able to view lab/test results, encounter notes, upcoming appointments, etc.  Non-urgent messages can be sent to your provider as well.   To learn more about what you can do with MyChart, go to [email or web address].    Your next appointment:   1 year(s)  The format for your next appointment:   In Person  Provider:   Cristopher Peru, MD{or one of the following Advanced Practice Providers on your designated Care Team:   Tommye Standard, [place] Legrand Como "Jonni Sanger" Chalmers Cater, [place]  Remote monitoring is used to monitor your Pacemaker from home. This monitoring reduces the number of office visits required to check your device to  one time per year. It allows [place] to keep an eye on the functioning of your device to ensure it is working properly. You are scheduled for a device check from home on 6/24. You may send your transmission at any time that day. If you have a wireless device, the transmission will be sent automatically. After your physician reviews your transmission, you will receive a postcard with your next transmission date.

## 2023-03-12 NOTE — Progress Notes (Signed)
HPI  Angelica Lane returns today for PPM followup. She is a pleasant frail 89 yo woman with a h/o sinus node dysfunction who presented with severe sinus pauses despite stopping the coreg and underwent PPM insertion. She has done amazingly well in the interim with no chest pain or sob. She has not had syncope but admits to being sedentary which her son corroborates. She does not have peripheral edema. Her bp has been fairly well controlled. She c/o feeling cold. She is now living in a SNF  Current Outpatient Medications  Medication Sig Dispense Refill   amLODipine (NORVASC) 10 MG tablet Take 1 tablet (10 mg total) by mouth daily.     carvedilol (COREG) 6.25 MG tablet Take 6.25 mg by mouth 2 (two) times daily with a meal.      donepezil (ARICEPT) 5 MG tablet Take 5 mg by mouth at bedtime.   1   Ensure (ENSURE) Take 237 mLs by mouth 2 (two) times daily between meals.     escitalopram (LEXAPRO) 5 MG tablet Take 5 mg by mouth daily.     ezetimibe (ZETIA) 10 MG tablet Take 10 mg by mouth at bedtime.     ketoconazole (NIZORAL) 2 % cream Apply 1 application. topically every other day. To dry skin areas     losartan (COZAAR) 100 MG tablet Take 100 mg by mouth daily.      memantine (NAMENDA) 5 MG tablet Take 5 mg by mouth 2 (two) times daily.     metFORMIN (GLUCOPHAGE-XR) 500 MG 24 hr tablet Take 500 mg by mouth in the morning and at bedtime.     NON FORMULARY Take by mouth See admin instructions. House Supplement 1 carton twice daily between meals     simvastatin (ZOCOR) 10 MG tablet Take 10 mg by mouth at bedtime.     Skin Protectants, Misc. (EUCERIN) cream Apply 1 application. topically daily. After bath or wash up     No Known Allergies   Current Outpatient Medications  Medication Sig Dispense Refill   amLODipine (NORVASC) 10 MG tablet Take 1 tablet (10 mg total) by mouth daily.     carvedilol (COREG) 6.25 MG tablet Take 6.25 mg by mouth 2 (two) times daily with a meal.      donepezil  (ARICEPT) 5 MG tablet Take 5 mg by mouth at bedtime.   1   Ensure (ENSURE) Take 237 mLs by mouth 2 (two) times daily between meals.     escitalopram (LEXAPRO) 5 MG tablet Take 5 mg by mouth daily.     ezetimibe (ZETIA) 10 MG tablet Take 10 mg by mouth at bedtime.     ketoconazole (NIZORAL) 2 % cream Apply 1 application. topically every other day. To dry skin areas     losartan (COZAAR) 100 MG tablet Take 100 mg by mouth daily.      memantine (NAMENDA) 5 MG tablet Take 5 mg by mouth 2 (two) times daily.     metFORMIN (GLUCOPHAGE-XR) 500 MG 24 hr tablet Take 500 mg by mouth in the morning and at bedtime.     NON FORMULARY Take by mouth See admin instructions. House Supplement 1 carton twice daily between meals     simvastatin (ZOCOR) 10 MG tablet Take 10 mg by mouth at bedtime.     Skin Protectants, Misc. (EUCERIN) cream Apply 1 application. topically daily. After bath or wash up     No current facility-administered medications for this visit.  Past Medical History:  Diagnosis Date   Dementia ([place])    Depression    Diabetes mellitus without complication ([place])    High cholesterol    Hypertension    Symptomatic bradycardia 09/05/2020    ROS:   All systems reviewed and negative except as noted in the HPI.   Past Surgical History:  Procedure Laterality Date   APPENDECTOMY     PACEMAKER IMPLANT N/A 09/06/2020   Procedure: PACEMAKER IMPLANT;  Surgeon: Evans Lance, MD;  Location: [facility];  Service: Cardiovascular;  Laterality: N/A;     Family History  Problem Relation Age of Onset   Healthy Mother    Healthy Father      Social History   Socioeconomic History   Marital status: Single    Spouse name: Not on file   Number of children: Not on file   Years of education: Not on file   Highest education level: Not on file  Occupational History   Not on file  Tobacco Use   Smoking status: Never   Smokeless tobacco: Never  Vaping Use   Vaping Use: Never used   Substance and Sexual Activity   Alcohol use: Not Currently   Drug use: Never   Sexual activity: Not on file  Other Topics Concern   Not on file  Social History Narrative   Not on file   Social Determinants of Health   Financial Resource Strain: Not on file  Food Insecurity: Not on file  Transportation Needs: Not on file  Physical Activity: Not on file  Stress: Not on file  Social Connections: Not on file  Intimate Partner Violence: Not on file     BP 122/66   Pulse (!) 55   Ht 4\' 11"  (1.499 m)   Wt 93 lb (42.2 kg)   SpO2 99%   BMI 18.78 kg/m   Physical Exam:  Well appearing NAD HEENT: Unremarkable Neck:  No JVD, no thyromegally Lymphatics:  No adenopathy Back:  No CVA tenderness Lungs:  Clear with no wheezes HEART:  Regular rate rhythm, no murmurs, no rubs, no clicks Abd:  soft, positive bowel sounds, no organomegally, no rebound, no guarding Ext:  2 plus pulses, no edema, no cyanosis, no clubbing Skin:  No rashes no nodules Neuro:  CN II through XII intact, motor grossly intact  EKG -   DEVICE  Normal device function.  See PaceArt for details.   Assess/Plan:  Sinus node dysfunction - she is improved s/p PPM insertion. PPM - her [employer] DDD PM is working normally. We will recheck in several months. HTN - her bp is well controlled today. We will follow. Dementia - she appears stable and is pleasantly demented today.   Carleene Overlie Claudette W[place]uth,MD

## 2023-03-15 ENCOUNTER — Ambulatory Visit (INDEPENDENT_AMBULATORY_CARE_PROVIDER_SITE_OTHER): Payer: Medicare Other

## 2023-03-15 DIAGNOSIS — I495 Sick sinus syndrome: Secondary | ICD-10-CM

## 2023-03-16 LAB — CUP PACEART REMOTE DEVICE CHECK
Battery Remaining Longevity: 130 mo
Battery Remaining Percentage: 88 %
Battery Voltage: 3.05 V
Brady Statistic RV Percent Paced: 1 %
Date Time Interrogation Session: 20240401121044
Implantable Lead Connection Status: 753985
Implantable Lead Implant Date: 20210924
Implantable Lead Location: 753860
Implantable Pulse Generator Implant Date: 20210924
Lead Channel Impedance Value: 530 Ohm
Lead Channel Pacing Threshold Amplitude: 0.75 V
Lead Channel Pacing Threshold Pulse Width: 0.4 ms
Lead Channel Sensing Intrinsic Amplitude: 11.7 mV
Lead Channel Setting Pacing Amplitude: 2.5 V
Lead Channel Setting Pacing Pulse Width: 0.4 ms
Lead Channel Setting Sensing Sensitivity: 2 mV
Pulse Gen Model: 1272
Pulse Gen Serial Number: 3828073

## 2023-04-06 ENCOUNTER — Non-Acute Institutional Stay: Payer: Medicare Other | Admitting: Hospice

## 2023-04-06 DIAGNOSIS — Z515 Encounter for palliative care: Secondary | ICD-10-CM

## 2023-04-06 DIAGNOSIS — F03911 Unspecified dementia, unspecified severity, with agitation: Secondary | ICD-10-CM

## 2023-04-06 DIAGNOSIS — R634 Abnormal weight loss: Secondary | ICD-10-CM

## 2023-04-06 DIAGNOSIS — F03918 Unspecified dementia, unspecified severity, with other behavioral disturbance: Secondary | ICD-10-CM

## 2023-04-06 NOTE — Progress Notes (Signed)
    Therapist, nutritional Palliative Care Consult Note Telephone: [phone]  Fax: [phone]     PATIENT NAME: Angelica Lane   [phone] (home)  DOB: May 13, 1928 MRN: [id number] PRIMARY CARE PROVIDER:    Harvest Forest, MD,  [address] Raeanne Gathers [place] [address] [phone]  REFERRING PROVIDER:   Dr. Clement Sayres RESPONSIBLE PARTY:    Contact Information     Name Relation Home Work Mobile   Angelica Lane,Angelica Lane Relative [phone]  [phone]   Angelica Lane, Angelica Lane [phone]  [phone]        I met face to face with patient  in the facility. Palliative Care was asked to follow this patient by consultation request of  Harvest Forest, MD to address advance care planning and complex medical decision making. This is a follow up visit.                                   ASSESSMENT AND PLAN / RECOMMENDATIONS:   Advance Care Planning/Goals of Care: Goals include to maximize quality of life and symptom management.  CODE STATUS: DNR  MOST form selections include limited additional intervention, antibiotics if indicated, IV fluids if indicated, no feeding tube.  Symptom Management/Plan:  Dementia: ongoing memory loss/confusion, impoverished thoughts, limited language in line with dementia disease trajectory. Continue to provide assistance in ADLs. Reorient and redirect as needed; fall/safety precautions. Fall/safety precautions. FAST 6 D. Continue Aricept and Namenda as directed. Continue ongoing supportive care.  Fall/safety precautions.   Agitation: Use de-escalation techniques.  Continue Escitalopram. Follow up with Psych as planned/needed.   Abnormal weight loss:  Continue nutritional house supplements BID in between meals.  Offer assistance during meals to ensure adequate oral intake.  Routine weights per facility. Current weight is 95 Ibs 03/26/23 96.2 Ibs March  '24, 99.2 Ibs December 2023, height 4 feet 11 inches. .   Follow up Palliative Care Visit: Palliative care will continue to follow for complex medical decision making, advance care planning, and clarification of goals. Return in 8 weeks or prn.   PPS: 40%  HOSPICE ELIGIBILITY/DIAGNOSIS: TBD  Chief Complaint: Palliative Medicine follow up visit.   HISTORY OF PRESENT ILLNESS:  Angelica Lane is a 89 y.o. year old female  with dementia, T2DM, hypertension, depression, hypertension, hyperlipidemia, Xerosis.  Patient in no acute distress, denies pain/discomfort.  No hospitalization since last visit. Nursing reports she sometimes refuses care, more cooperative than before.  History obtained from review of EMR, discussion with primary team, and interview with family, facility staff/caregiver and/or Angelica Lane.  I reviewed available labs, medications, imaging, studies and related documents from the EMR.  Records reviewed and summarized above.   I spent 35 minutes providing this consultation; time includes spent with patient/family, chart review and documentation. More than 50% of the time in this consultation was spent on care coordination.  Thank you for the opportunity to participate in the care of Angelica Lane.  The palliative care team will continue to follow. Please call our office at [phone] if we can be of additional assistance.   Rosaura Carpenter, NP

## 2023-04-20 NOTE — Progress Notes (Signed)
Remote pacemaker transmission.   

## 2023-04-26 ENCOUNTER — Telehealth: Payer: Self-pay

## 2023-04-26 NOTE — Telephone Encounter (Signed)
Following alert received from [employer] received for HVR, event occurred 4/16 @ 18:00, EGM with irregular R-R, HR 211, duration 53 sec. Presenting regular VS. No hx of PAF noted, pt resides in SNF, dementia per EPIC.  Routing to Dr. Ladona Ridgel due to no [facility] on file. May not be candidate with hx.

## 2023-05-02 NOTE — Telephone Encounter (Signed)
Looks like asymptomatic atrial fib with a RVR. No change in treatment. GT

## 2023-05-07 ENCOUNTER — Non-Acute Institutional Stay: Payer: Medicare Other | Admitting: Hospice

## 2023-05-07 DIAGNOSIS — F32A Depression, unspecified: Secondary | ICD-10-CM

## 2023-05-07 DIAGNOSIS — R634 Abnormal weight loss: Secondary | ICD-10-CM

## 2023-05-07 DIAGNOSIS — Z515 Encounter for palliative care: Secondary | ICD-10-CM

## 2023-05-07 DIAGNOSIS — F03918 Unspecified dementia, unspecified severity, with other behavioral disturbance: Secondary | ICD-10-CM

## 2023-05-07 NOTE — Progress Notes (Signed)
    Therapist, nutritional Palliative Care Consult Note Telephone: [phone]  Fax: [phone]     PATIENT NAME: Angelica Lane Angelica Lane [place] [address]   [phone] (home)  DOB: 04/20/1928 MRN: [id number] PRIMARY CARE PROVIDER:    Harvest Forest, MD,  [address] Angelica Lane [phone]  REFERRING PROVIDER:   Dr. Clement Sayres RESPONSIBLE PARTY:    Contact Information     Name Relation Home Work Mobile   Angelica Lane Relative [phone]  [phone]   Angelica Lane [phone]  [phone]        I met face to face with patient  in the facility. Palliative Care was asked to follow this patient by consultation request of  Harvest Forest, MD to address advance care planning and complex medical decision making. This is a follow up visit.                                   ASSESSMENT AND PLAN / RECOMMENDATIONS:   Advance Care Planning/Goals of Care: Goals include to maximize quality of life and symptom management.  CODE STATUS: DNR  MOST form selections include limited additional intervention, antibiotics if indicated, IV fluids if indicated, no feeding tube.  Symptom Management/Plan: Abnormal weight loss:  Initiate Mirtazapine 7.5 mg at bedtime to help boost appetite. Continue nutritional house supplements BID in between meals.  Offer assistance during meals to ensure adequate oral intake.  Routine weights per facility. Current weight is 93.8 Ibs 04/21/23 95 Ibs 03/26/23 96.2 Ibs March '24, 99.2 Ibs December 2023, height 4 feet 11 inches.  Dementia: ongoing memory loss/confusion, impoverished thoughts, limited language in line with dementia disease trajectory. Continue to provide assistance in ADLs. Reorient and redirect as needed; fall/safety precautions. Fall/safety precautions. FAST 6 D. Continue Aricept and Namenda as directed. Continue ongoing supportive care.   Fall/safety precautions.   Depression and anxiety: Encourage socialization and participation in facility activities.  Use de-escalation techniques.  Continue Escitalopram. Follow up with Psych as planned/needed.    Follow up Palliative Care Visit: Palliative care will continue to follow for complex medical decision making, advance care planning, and clarification of goals. Return in 8 weeks or prn.   PPS: 40%  HOSPICE ELIGIBILITY/DIAGNOSIS: TBD  Chief Complaint: Palliative Medicine follow up visit.   HISTORY OF PRESENT ILLNESS:  Angelica Lane is a 89 y.o. year old female  with dementia, T2DM, hypertension, depression, hypertension, hyperlipidemia, Xerosis.  Patient in no acute distress, denies pain/discomfort.  No hospitalization since last visit. Nursing reports she sometimes refuses care, more cooperative than before. Discussion with patient on need to cooperate with plan of care; nursing to continue with education and gentle persuasion.  History obtained from review of EMR, discussion with primary team, and interview with family, facility staff/caregiver and/or Angelica Lane.  I reviewed available labs, medications, imaging, studies and related documents from the EMR.  Records reviewed and summarized above.   I spent 35 minutes providing this consultation; time includes spent with patient/family, chart review and documentation. More than 50% of the time in this consultation was spent on care coordination.  Thank you for the opportunity to participate in the care of Angelica Lane.  The palliative care team will continue to follow. Please call our office at [phone] if we can be of additional assistance.   Rosaura Carpenter, NP

## 2023-05-17 ENCOUNTER — Ambulatory Visit (INDEPENDENT_AMBULATORY_CARE_PROVIDER_SITE_OTHER): Payer: Medicare Other

## 2023-05-17 DIAGNOSIS — I495 Sick sinus syndrome: Secondary | ICD-10-CM | POA: Diagnosis not present

## 2023-05-17 LAB — CUP PACEART REMOTE DEVICE CHECK
Battery Remaining Longevity: 128 mo
Battery Remaining Percentage: 87 %
Battery Voltage: 3.05 V
Brady Statistic RV Percent Paced: 1 %
Date Time Interrogation Session: 20240603020013
Implantable Lead Connection Status: 753985
Implantable Lead Implant Date: 20210924
Implantable Lead Location: 753860
Implantable Pulse Generator Implant Date: 20210924
Lead Channel Impedance Value: 460 Ohm
Lead Channel Pacing Threshold Amplitude: 0.75 V
Lead Channel Pacing Threshold Pulse Width: 0.4 ms
Lead Channel Sensing Intrinsic Amplitude: 9.7 mV
Lead Channel Setting Pacing Amplitude: 2.5 V
Lead Channel Setting Pacing Pulse Width: 0.4 ms
Lead Channel Setting Sensing Sensitivity: 2 mV
Pulse Gen Model: 1272
Pulse Gen Serial Number: 3828073

## 2023-06-08 NOTE — Progress Notes (Signed)
Remote pacemaker transmission.   

## 2023-08-17 ENCOUNTER — Ambulatory Visit (INDEPENDENT_AMBULATORY_CARE_PROVIDER_SITE_OTHER): Payer: Medicare Other

## 2023-08-17 DIAGNOSIS — I495 Sick sinus syndrome: Secondary | ICD-10-CM | POA: Diagnosis not present

## 2023-08-17 LAB — CUP PACEART REMOTE DEVICE CHECK
Battery Remaining Longevity: 125 mo
Battery Remaining Percentage: 85 %
Battery Voltage: 3.05 V
Brady Statistic RV Percent Paced: 1 %
Date Time Interrogation Session: 20240902020015
Implantable Lead Connection Status: 753985
Implantable Lead Implant Date: 20210924
Implantable Lead Location: 753860
Implantable Pulse Generator Implant Date: 20210924
Lead Channel Impedance Value: 430 Ohm
Lead Channel Pacing Threshold Amplitude: 0.75 V
Lead Channel Pacing Threshold Pulse Width: 0.4 ms
Lead Channel Sensing Intrinsic Amplitude: 9 mV
Lead Channel Setting Pacing Amplitude: 2.5 V
Lead Channel Setting Pacing Pulse Width: 0.4 ms
Lead Channel Setting Sensing Sensitivity: 2 mV
Pulse Gen Model: 1272
Pulse Gen Serial Number: 3828073

## 2023-08-25 NOTE — Progress Notes (Signed)
Remote pacemaker transmission.   

## 2023-11-15 ENCOUNTER — Ambulatory Visit (INDEPENDENT_AMBULATORY_CARE_PROVIDER_SITE_OTHER): Payer: Medicare Other

## 2023-11-15 DIAGNOSIS — I495 Sick sinus syndrome: Secondary | ICD-10-CM

## 2023-11-15 LAB — CUP PACEART REMOTE DEVICE CHECK
Battery Remaining Longevity: 124 mo
Battery Remaining Percentage: 84 %
Battery Voltage: 3.04 V
Brady Statistic RV Percent Paced: 1 %
Date Time Interrogation Session: 20241202020023
Implantable Lead Connection Status: 753985
Implantable Lead Implant Date: 20210924
Implantable Lead Location: 753860
Implantable Pulse Generator Implant Date: 20210924
Lead Channel Impedance Value: 450 Ohm
Lead Channel Pacing Threshold Amplitude: 0.75 V
Lead Channel Pacing Threshold Pulse Width: 0.4 ms
Lead Channel Sensing Intrinsic Amplitude: 10.2 mV
Lead Channel Setting Pacing Amplitude: 2.5 V
Lead Channel Setting Pacing Pulse Width: 0.4 ms
Lead Channel Setting Sensing Sensitivity: 2 mV
Pulse Gen Model: 1272
Pulse Gen Serial Number: 3828073

## 2023-11-20 ENCOUNTER — Emergency Department (HOSPITAL_COMMUNITY)
Admission: EM | Admit: 2023-11-20 | Discharge: 2023-11-20 | Disposition: A | Discharge status: Home / Self Care | Payer: Medicare Other | Attending: Emergency Medicine | Admitting: Emergency Medicine

## 2023-11-20 ENCOUNTER — Encounter (HOSPITAL_COMMUNITY): Payer: Self-pay

## 2023-11-20 ENCOUNTER — Emergency Department (HOSPITAL_COMMUNITY): Payer: Medicare Other

## 2023-11-20 ENCOUNTER — Other Ambulatory Visit: Payer: Self-pay

## 2023-11-20 DIAGNOSIS — Z20822 Contact with and (suspected) exposure to covid-19: Secondary | ICD-10-CM | POA: Insufficient documentation

## 2023-11-20 DIAGNOSIS — R509 Fever, unspecified: Secondary | ICD-10-CM | POA: Insufficient documentation

## 2023-11-20 DIAGNOSIS — S0990XA Unspecified injury of head, initial encounter: Secondary | ICD-10-CM | POA: Insufficient documentation

## 2023-11-20 DIAGNOSIS — E119 Type 2 diabetes mellitus without complications: Secondary | ICD-10-CM | POA: Insufficient documentation

## 2023-11-20 DIAGNOSIS — Y92129 Unspecified place in nursing home as the place of occurrence of the external cause: Secondary | ICD-10-CM | POA: Insufficient documentation

## 2023-11-20 DIAGNOSIS — I10 Essential (primary) hypertension: Secondary | ICD-10-CM | POA: Insufficient documentation

## 2023-11-20 DIAGNOSIS — W19XXXA Unspecified fall, initial encounter: Secondary | ICD-10-CM | POA: Diagnosis not present

## 2023-11-20 DIAGNOSIS — Z79899 Other long term (current) drug therapy: Secondary | ICD-10-CM | POA: Insufficient documentation

## 2023-11-20 DIAGNOSIS — Z950 Presence of cardiac pacemaker: Secondary | ICD-10-CM | POA: Diagnosis not present

## 2023-11-20 DIAGNOSIS — Z7984 Long term (current) use of oral hypoglycemic drugs: Secondary | ICD-10-CM | POA: Insufficient documentation

## 2023-11-20 DIAGNOSIS — F0390 Unspecified dementia without behavioral disturbance: Secondary | ICD-10-CM | POA: Insufficient documentation

## 2023-11-20 LAB — CBC WITH DIFFERENTIAL/PLATELET
Abs Immature Granulocytes: 0.01 10*3/uL (ref 0.00–0.07)
Basophils Absolute: 0 10*3/uL (ref 0.0–0.1)
Basophils Relative: 0 %
Eosinophils Absolute: 0 10*3/uL (ref 0.0–0.5)
Eosinophils Relative: 0 %
HCT: 38.8 % (ref 36.0–46.0)
Hemoglobin: 12.6 g/dL (ref 12.0–15.0)
Immature Granulocytes: 0 %
Lymphocytes Relative: 19 %
Lymphs Abs: 1.2 10*3/uL (ref 0.7–4.0)
MCH: 31.3 pg (ref 26.0–34.0)
MCHC: 32.5 g/dL (ref 30.0–36.0)
MCV: 96.5 fL (ref 80.0–100.0)
Monocytes Absolute: 0.4 10*3/uL (ref 0.1–1.0)
Monocytes Relative: 7 %
Neutro Abs: 4.6 10*3/uL (ref 1.7–7.7)
Neutrophils Relative %: 74 %
Platelets: 253 10*3/uL (ref 150–400)
RBC: 4.02 MIL/uL (ref 3.87–5.11)
RDW: 13.5 % (ref 11.5–15.5)
WBC: 6.3 10*3/uL (ref 4.0–10.5)
nRBC: 0 % (ref 0.0–0.2)

## 2023-11-20 LAB — COMPREHENSIVE METABOLIC PANEL
ALT: 18 U/L (ref 0–44)
AST: 22 U/L (ref 15–41)
Albumin: 3.9 g/dL (ref 3.5–5.0)
Alkaline Phosphatase: 120 U/L (ref 38–126)
Anion gap: 16 — ABNORMAL HIGH (ref 5–15)
BUN: 23 mg/dL (ref 8–23)
CO2: 21 mmol/L — ABNORMAL LOW (ref 22–32)
Calcium: 10 mg/dL (ref 8.9–10.3)
Chloride: 105 mmol/L (ref 98–111)
Creatinine, Ser: 1.07 mg/dL — ABNORMAL HIGH (ref 0.44–1.00)
GFR, Estimated: 48 mL/min — ABNORMAL LOW (ref >60)
Glucose, Bld: 188 mg/dL — ABNORMAL HIGH (ref 70–99)
Potassium: 4.4 mmol/L (ref 3.5–5.1)
Sodium: 142 mmol/L (ref 135–145)
Total Bilirubin: 0.5 mg/dL (ref <1.2)
Total Protein: 6.7 g/dL (ref 6.5–8.1)

## 2023-11-20 LAB — URINALYSIS, W/ REFLEX TO CULTURE (INFECTION SUSPECTED)
Bilirubin Urine: NEGATIVE
Glucose, UA: NEGATIVE mg/dL
Hgb urine dipstick: NEGATIVE
Ketones, ur: NEGATIVE mg/dL
Leukocytes,Ua: NEGATIVE
Nitrite: NEGATIVE
Protein, ur: 30 mg/dL — AB
Specific Gravity, Urine: 1.012 (ref 1.005–1.030)
pH: 7 (ref 5.0–8.0)

## 2023-11-20 LAB — RESP PANEL BY RT-PCR (RSV, FLU A&B, COVID)  RVPGX2
Influenza A by PCR: NEGATIVE
Influenza B by PCR: NEGATIVE
Resp Syncytial Virus by PCR: NEGATIVE
SARS Coronavirus 2 by RT PCR: NEGATIVE

## 2023-11-20 LAB — I-STAT CG4 LACTIC ACID, ED
Lactic Acid, Venous: 3.4 mmol/L (ref 0.5–1.9)
Lactic Acid, Venous: 1.2 mmol/L (ref 0.5–1.9)

## 2023-11-20 MED ORDER — SODIUM CHLORIDE 0.9 % IV BOLUS
1000.0000 mL | Freq: Once | INTRAVENOUS | Status: AC
  Administered 2023-11-20: 1000 mL via INTRAVENOUS

## 2023-11-20 MED ORDER — LACTATED RINGERS IV BOLUS (SEPSIS): 500.0000 mL | Freq: Once | INTRAVENOUS | Status: DC

## 2023-11-20 MED ORDER — HALOPERIDOL LACTATE 5 MG/ML IJ SOLN
2.0000 mg | Freq: Once | INTRAMUSCULAR | Status: AC
  Administered 2023-11-20: 2 mg via INTRAVENOUS
  Filled 2023-11-20: qty 1

## 2023-11-20 MED ORDER — LACTATED RINGERS IV BOLUS: 500.0000 mL | Freq: Once | INTRAVENOUS | Status: DC

## 2023-11-20 NOTE — ED Notes (Signed)
Unable to collect discharge vital signs. Pt demanding not to be touched.  Pt begins kicking staff and physician. Able to move all extremities with ease.   No complaint of pain.

## 2023-11-20 NOTE — ED Notes (Signed)
When this RN was attempting to start fluids, the pt became hostile and stated that I had already given her fluids.  She attempted to rip out her IV.  Multiple attempts to reorient pt to reality failed.

## 2023-11-20 NOTE — ED Notes (Signed)
Pt calm.  Able to follow commands.  Used bedside commode with 2 assist.

## 2023-11-20 NOTE — ED Notes (Signed)
[facility] notification made for transport to facility.

## 2023-11-20 NOTE — ED Provider Notes (Signed)
[place] EMERGENCY DEPARTMENT AT [facility] Provider Note   CSN: [id number] Arrival date & time: 11/20/23  1313     History  Chief Complaint  Patient presents with   Fall   Fever    Angelica Lane is a 89 y.o. female.  HPI Patient presents for fall.  Medical history includes dementia, depression, diabetes, HLD, HTN, sinus node dysfunction s/p pacemaker placement.  She arrives via EMS from skilled nursing facility.  Fall was unwitnessed.  Patient states that she lost her balance and fell.  She did endorse striking her head.  She was able to get up off the floor on her own power.  She denies any current areas of pain.  EMS noted normal blood glucose.  Although there was no report of fevers at nursing facility, they did note a low-grade temperature of 100.1 degrees during transit.  Nursing facility reports that she often refuses her medications and they are concerned of a UTI.  She has been refusing urinary catheter to assess urine at facility.    Home Medications Prior to Admission medications   Medication Sig Start Date End Date Taking? Authorizing Provider  amLODipine (NORVASC) 10 MG tablet Take 1 tablet (10 mg total) by mouth daily. 09/09/20   Lanae Boast, MD  carvedilol (COREG) 6.25 MG tablet Take 6.25 mg by mouth 2 (two) times daily with a meal.     [provider]  donepezil (ARICEPT) 5 MG tablet Take 5 mg by mouth at bedtime.  08/01/18   [provider]  Ensure (ENSURE) Take 237 mLs by mouth 2 (two) times daily between meals.    [provider]  escitalopram (LEXAPRO) 5 MG tablet Take 5 mg by mouth daily.    [provider]  ezetimibe (ZETIA) 10 MG tablet Take 10 mg by mouth at bedtime.    [provider]  ketoconazole (NIZORAL) 2 % cream Apply 1 application. topically every other day. To dry skin areas    [provider]  losartan (COZAAR) 100 MG tablet Take 100 mg by mouth daily.     [provider]  memantine (NAMENDA) 5 MG tablet Take 5 mg by mouth 2 (two) times daily.    [provider]  metFORMIN (GLUCOPHAGE-XR) 500 MG 24 hr tablet Take 500 mg by mouth in the morning and at bedtime.    [provider]  NON FORMULARY Take by mouth See admin instructions. House Supplement 1 carton twice daily between meals    [provider]  simvastatin (ZOCOR) 10 MG tablet Take 10 mg by mouth at bedtime.    [provider]  Skin Protectants, Misc. (EUCERIN) cream Apply 1 application. topically daily. After bath or wash up    [provider]  divalproex (DEPAKOTE SPRINKLE) 125 MG capsule divalproex 125 mg capsule,delayed release sprinkle twice a day  10/25/19  [provider]  metoprolol succinate (TOPROL-XL) 100 MG 24 hr tablet metoprolol succinate ER 200 mg tablet,extended release 24 hr in the morning  10/25/19  [provider]      Allergies    Patient has no known allergies.    Review of Systems   Review of Systems  Unable to perform ROS: Dementia  Cardiovascular:  Negative for chest pain.  Gastrointestinal:  Negative for abdominal pain.  Musculoskeletal:  Negative for arthralgias and back pain.  All other systems reviewed and are negative.   Physical Exam Updated Vital Signs BP (!) 155/80 (BP Location: Left  Arm)   Pulse 65   Temp 100.1 F (37.8 C) (Oral)   Resp 20   SpO2 100%  Physical Exam Vitals and nursing note reviewed.  Constitutional:      General: She is not in acute distress.    Appearance: Normal appearance. She is well-developed. She is not ill-appearing, toxic-appearing or diaphoretic.  HENT:     Head: Normocephalic and atraumatic.     Right Ear: External ear normal.     Left Ear: External ear normal.     Nose: Nose normal.     Mouth/Throat:     Mouth: Mucous membranes are moist.  Eyes:     Extraocular Movements: Extraocular movements intact.     Conjunctiva/sclera: Conjunctivae normal.   Cardiovascular:     Rate and Rhythm: Normal rate and regular rhythm.     Heart sounds: No murmur heard. Pulmonary:     Effort: Pulmonary effort is normal. No respiratory distress.     Breath sounds: Normal breath sounds. No wheezing or rales.  Chest:     Chest wall: No tenderness.  Abdominal:     General: There is no distension.     Palpations: Abdomen is soft.     Tenderness: There is no abdominal tenderness.  Musculoskeletal:        General: No swelling or deformity. Normal range of motion.     Cervical back: Normal range of motion and neck supple.  Skin:    General: Skin is warm and dry.     Coloration: Skin is not jaundiced or pale.  Neurological:     General: No focal deficit present.     Mental Status: She is alert.  Psychiatric:        Mood and Affect: Mood normal.        Behavior: Behavior normal.     ED Results / Procedures / Treatments   Labs (all labs ordered are listed, but only abnormal results are displayed) Labs Reviewed  COMPREHENSIVE METABOLIC PANEL - Abnormal; Notable for the following components:      Result Value   CO2 21 (*)    Glucose, Bld 188 (*)    Creatinine, Ser 1.07 (*)    GFR, Estimated 48 (*)    Anion gap 16 (*)    All other components within normal limits  URINALYSIS, W/ REFLEX TO CULTURE (INFECTION SUSPECTED) - Abnormal; Notable for the following components:   APPearance HAZY (*)    Protein, ur 30 (*)    Bacteria, UA RARE (*)    All other components within normal limits  I-STAT CG4 LACTIC ACID, ED - Abnormal; Notable for the following components:   Lactic Acid, Venous 3.4 (*)    All other components within normal limits  RESP PANEL BY RT-PCR (RSV, FLU A&B, COVID)  [id number]  CBC WITH DIFFERENTIAL/PLATELET  I-STAT CG4 LACTIC ACID, ED    EKG None  Radiology CT HEAD WO CONTRAST  Result Date: 11/20/2023 CLINICAL DATA:  Head trauma, moderate-severe; Polytrauma, blunt EXAM: CT HEAD WITHOUT CONTRAST CT CERVICAL SPINE WITHOUT CONTRAST  TECHNIQUE: Multidetector CT imaging of the head and cervical spine was performed following the standard protocol without intravenous contrast. Multiplanar CT image reconstructions of the cervical spine were also generated. RADIATION DOSE REDUCTION: This exam was performed according to the departmental dose-optimization program which includes automated exposure control, adjustment of the mA and/or kV according to patient size and/or use of iterative reconstruction technique. COMPARISON:  CT C Spine 03/28/22 FINDINGS: CT HEAD FINDINGS Brain:  No hemorrhage. No hydrocephalus. No extra-axial fluid collection. No CT evidence of an acute cortical infarct. No mass effect. No mass lesion. Generalized volume loss. Vascular: No hyperdense vessel or unexpected calcification. Skull: Normal. Negative for fracture or focal lesion. Sinuses/Orbits: No middle ear or mastoid effusion. Paranasal sinuses are clear. Bilateral lens replacement. Orbits are otherwise unremarkable. Other: None. CT CERVICAL SPINE FINDINGS Alignment: Straightening of the normal cervical lordosis. Grade 1 anterolisthesis of C4 on C5 and C7 on T1. Trace retrolisthesis of C5 on C6. Skull base and vertebrae: No acute fracture. No primary bone lesion or focal pathologic process. Soft tissues and spinal canal: No prevertebral fluid or swelling. No visible canal hematoma. Disc levels:  No evidence of high-grade spinal canal stenosis. Upper chest: Unchanged ground-glass opacity in the right upper lobe Other: Negative IMPRESSION: 1. No acute intracranial abnormality. 2. No acute fracture or traumatic subluxation of the cervical spine. Electronically Signed   By: Lorenza Cambridge M.D.   On: 11/20/2023 15:33   CT CERVICAL SPINE WO CONTRAST  Result Date: 11/20/2023 CLINICAL DATA:  Head trauma, moderate-severe; Polytrauma, blunt EXAM: CT HEAD WITHOUT CONTRAST CT CERVICAL SPINE WITHOUT CONTRAST TECHNIQUE: Multidetector CT imaging of the head and cervical spine was performed  following the standard protocol without intravenous contrast. Multiplanar CT image reconstructions of the cervical spine were also generated. RADIATION DOSE REDUCTION: This exam was performed according to the departmental dose-optimization program which includes automated exposure control, adjustment of the mA and/or kV according to patient size and/or use of iterative reconstruction technique. COMPARISON:  CT C Spine 03/28/22 FINDINGS: CT HEAD FINDINGS Brain: No hemorrhage. No hydrocephalus. No extra-axial fluid collection. No CT evidence of an acute cortical infarct. No mass effect. No mass lesion. Generalized volume loss. Vascular: No hyperdense vessel or unexpected calcification. Skull: Normal. Negative for fracture or focal lesion. Sinuses/Orbits: No middle ear or mastoid effusion. Paranasal sinuses are clear. Bilateral lens replacement. Orbits are otherwise unremarkable. Other: None. CT CERVICAL SPINE FINDINGS Alignment: Straightening of the normal cervical lordosis. Grade 1 anterolisthesis of C4 on C5 and C7 on T1. Trace retrolisthesis of C5 on C6. Skull base and vertebrae: No acute fracture. No primary bone lesion or focal pathologic process. Soft tissues and spinal canal: No prevertebral fluid or swelling. No visible canal hematoma. Disc levels:  No evidence of high-grade spinal canal stenosis. Upper chest: Unchanged ground-glass opacity in the right upper lobe Other: Negative IMPRESSION: 1. No acute intracranial abnormality. 2. No acute fracture or traumatic subluxation of the cervical spine. Electronically Signed   By: Lorenza Cambridge M.D.   On: 11/20/2023 15:33   DG Knee 2 Views Right  Result Date: 11/20/2023 CLINICAL DATA:  Un witnessed fall EXAM: RIGHT KNEE - 1-2 VIEW COMPARISON:  None Available. FINDINGS: Frontal and lateral views of the right knee are obtained. No acute fracture, subluxation, or dislocation. There is severe 3 compartmental osteoarthritis greatest in the medial and patellofemoral  compartments. Small likely reactive joint effusion. Soft tissues are otherwise unremarkable. Diffuse atherosclerosis. IMPRESSION: 1. Severe 3 compartmental osteoarthritis with likely reactive joint effusion. 2. No acute displaced fracture. Electronically Signed   By: Sharlet Salina M.D.   On: 11/20/2023 15:20   DG Hip Unilat W or Wo Pelvis 2-3 Views Right  Result Date: 11/20/2023 CLINICAL DATA:  Un witnessed fall EXAM: DG HIP (WITH OR WITHOUT PELVIS) 2-3V RIGHT COMPARISON:  None Available. FINDINGS: Frontal and frogleg lateral views of the right hip are obtained. Bones are osteopenic. No acute fracture, subluxation, or  dislocation. Mild joint space narrowing of the right hip. Diffuse atherosclerosis. Soft tissues are unremarkable. IMPRESSION: 1. Mild right hip osteoarthritis.  No acute displaced fracture. Electronically Signed   By: Sharlet Salina M.D.   On: 11/20/2023 15:18   DG Pelvis Portable  Result Date: 11/20/2023 CLINICAL DATA:  Trauma fall EXAM: PORTABLE PELVIS 1-2 VIEWS COMPARISON:  None Available. FINDINGS: Limited evaluation due to overlapping osseous structures and overlying soft tissues as well as patient rotation. There is no evidence of pelvic fracture or diastasis. The right hip is rotated with no definite acute displaced fracture or dislocation of either hips on frontal view. No pelvic bone lesions are seen. Bilateral sacroiliac joint degenerative changes. Mild bilateral hip degenerative changes. Degenerative changes visualized lower lumbar spine. Atherosclerotic plaque. IMPRESSION: 1. Negative for acute traumatic injury. Limited evaluation due to overlapping osseous structures and overlying soft tissues. 2.  Aortic Atherosclerosis (ICD10-I70.0). Electronically Signed   By: Tish Frederickson M.D.   On: 11/20/2023 13:54   DG Chest Port 1 View  Result Date: 11/20/2023 CLINICAL DATA:  Trauma EXAM: PORTABLE CHEST 1 VIEW COMPARISON:  Chest x-ray 09/07/2020 FINDINGS: Left chest wall cardiac  device in similar position. The heart and mediastinal contours are unchanged. Aortic calcification. No focal consolidation. No pulmonary edema. No pleural effusion. No pneumothorax. No acute osseous abnormality. Degenerative changes of bilateral shoulders. Degenerative changes of the spine. Limited evaluation due to overlapping osseous structures and overlying soft tissues. IMPRESSION: 1. No active disease. 2.  Aortic Atherosclerosis (ICD10-I70.0). Electronically Signed   By: Tish Frederickson M.D.   On: 11/20/2023 13:51    Procedures Procedures    Medications Ordered in ED Medications  sodium chloride 0.9 % bolus 1,000 mL (1,000 mLs Intravenous New Bag/Given 11/20/23 1802)    ED Course/ Medical Decision Making/ A&P                                 Medical Decision Making Amount and/or Complexity of Data Reviewed Labs: ordered. Radiology: ordered. ECG/medicine tests: ordered.   This patient presents to the ED for concern of fall, this involves an extensive number of treatment options, and is a complaint that carries with it a high risk of complications and morbidity.  The differential diagnosis includes acute injuries, underlying dehydration, deconditioning, infection, metabolic arrangements   Co morbidities that complicate the patient evaluation  dementia, depression, diabetes, HLD, HTN, sinus node dysfunction s/p pacemaker placement   Additional history obtained:  Additional history obtained from EMS External records from outside source obtained and reviewed including EMR   Lab Tests:  I Ordered, and personally interpreted labs.  The pertinent results include: Baseline creatinine, normal electrolytes, normal hemoglobin, no leukocytosis.  Initial lactate was elevated but resolved prior to fluids being given.  I suspect lab error on initial lactate.   Imaging Studies ordered:  I ordered imaging studies including x-ray of chest, pelvis, right hip, right knee; CT scan of head,  cervical spine I independently visualized and interpreted imaging which showed no acute findings I agree with the radiologist interpretation   Cardiac Monitoring: / EKG:  The patient was maintained on a cardiac monitor.  I personally viewed and interpreted the cardiac monitored which showed an underlying rhythm of: Sinus rhythm   Problem List / ED Course / Critical interventions / Medication management  Patient presents after a fall at skilled nursing facility.  She did reportedly strike her head.  Per chart  review, she is not on a blood thinner.  Patient arrives awake and alert.  She has no complaints.  She is unable to tell me the year but does know that Christmas is coming up.  She does know that we are in [place].  She does have a history of dementia.  Patient is able to move all extremities without difficulty or discomfort.  She has no chest or abdominal tenderness.  There is no external evidence of head trauma.  Given report of fall, will obtain head and C-spine CT.  Given concern of infection, lab work was ordered.  While in the ED, patient had another unwitnessed fall.  She was found on the ground in her room.  On assessment, she was sitting up.  She was assisted to her feet. She endorses pain in area of right hip.  There is no obvious deformity.  Right hip x-ray was ordered.  Additional x-ray studies did not show acute findings.  Patient's initial lactate was elevated.  Remaining lab work was unremarkable.  Repeat lactate did normalize prior to fluids being given.  I suspect lab error on the initial.  Urinalysis showed no evidence of UTI.  Patient did have some agitation while in the ED.  She did require soft restraints to complete workup.  When attempting to stand her up, patient would refuse.  She did have full range of motion in bilateral hips.  She has good strength as she attempts to kick ED staff.  Patient was discharged in stable condition. I ordered medication including IV fluids for  hydration Reevaluation of the patient after these medicines showed that the patient improved I have reviewed the patients home medicines and have made adjustments as needed   Social Determinants of Health:  Resides in nursing facility        Final Clinical Impression(s) / ED Diagnoses Final diagnoses:  Fall, initial encounter    Rx / DC Orders ED Discharge Orders     None         Gloris Manchester, MD 11/20/23 1958

## 2023-11-20 NOTE — Discharge Instructions (Addendum)
Test results today were reassuring.  Imaging studies did not show any new injuries.  Urinalysis did not show UTI.  Continue home medications as prescribed.  Return to the emergency department for any new or worsening symptoms of concern.

## 2023-11-20 NOTE — ED Triage Notes (Signed)
Patient BIB EMS from [facility] today. Patient had an unwitnessed fall today while trying to get up from her bed to her wheelchair. She states she lost her footing and slid down and hit her bottom. She denies LOC. She is not on blood thinners. Denies any pain at this time. Dementia at baseline. Is alert to self and situation   Vitals   110/80 100% on room air 100.1 oral  248 CBG 60 HR 19 RR

## 2023-11-20 NOTE — ED Notes (Signed)
Attempted IV but patient was too agitated will try again in a few minutes to see if she has calmed down. Was able to obtain blood work

## 2024-02-14 ENCOUNTER — Ambulatory Visit (INDEPENDENT_AMBULATORY_CARE_PROVIDER_SITE_OTHER): Payer: Medicare Other

## 2024-02-14 DIAGNOSIS — I495 Sick sinus syndrome: Secondary | ICD-10-CM | POA: Diagnosis not present

## 2024-02-14 LAB — CUP PACEART REMOTE DEVICE CHECK
Battery Remaining Longevity: 122 mo
Battery Remaining Percentage: 83 %
Battery Voltage: 3.04 V
Brady Statistic RV Percent Paced: 1 %
Date Time Interrogation Session: 20250303020015
Implantable Lead Connection Status: 753985
Implantable Lead Implant Date: 20210924
Implantable Lead Location: 753860
Implantable Pulse Generator Implant Date: 20210924
Lead Channel Impedance Value: 480 Ohm
Lead Channel Pacing Threshold Amplitude: 0.75 V
Lead Channel Pacing Threshold Pulse Width: 0.4 ms
Lead Channel Sensing Intrinsic Amplitude: 10 mV
Lead Channel Setting Pacing Amplitude: 2.5 V
Lead Channel Setting Pacing Pulse Width: 0.4 ms
Lead Channel Setting Sensing Sensitivity: 2 mV
Pulse Gen Model: 1272
Pulse Gen Serial Number: 3828073

## 2024-03-16 NOTE — Progress Notes (Signed)
 Remote pacemaker transmission.

## 2024-05-15 ENCOUNTER — Ambulatory Visit (INDEPENDENT_AMBULATORY_CARE_PROVIDER_SITE_OTHER)

## 2024-05-15 DIAGNOSIS — I495 Sick sinus syndrome: Secondary | ICD-10-CM | POA: Diagnosis not present

## 2024-05-15 LAB — CUP PACEART REMOTE DEVICE CHECK
Battery Remaining Longevity: 120 mo
Battery Remaining Percentage: 81 %
Battery Voltage: 3.04 V
Brady Statistic RV Percent Paced: 1 %
Date Time Interrogation Session: 20250602020013
Implantable Lead Connection Status: 753985
Implantable Lead Implant Date: 20210924
Implantable Lead Location: 753860
Implantable Pulse Generator Implant Date: 20210924
Lead Channel Impedance Value: 440 Ohm
Lead Channel Pacing Threshold Amplitude: 0.75 V
Lead Channel Pacing Threshold Pulse Width: 0.4 ms
Lead Channel Sensing Intrinsic Amplitude: 9.3 mV
Lead Channel Setting Pacing Amplitude: 2.5 V
Lead Channel Setting Pacing Pulse Width: 0.4 ms
Lead Channel Setting Sensing Sensitivity: 2 mV
Pulse Gen Model: 1272
Pulse Gen Serial Number: 3828073

## 2024-05-18 ENCOUNTER — Ambulatory Visit: Payer: Self-pay | Admitting: Internal Medicine

## 2024-07-03 NOTE — Progress Notes (Signed)
 Remote pacemaker transmission.

## 2024-08-14 ENCOUNTER — Encounter

## 2024-08-14 ENCOUNTER — Ambulatory Visit (INDEPENDENT_AMBULATORY_CARE_PROVIDER_SITE_OTHER)

## 2024-08-14 DIAGNOSIS — I495 Sick sinus syndrome: Secondary | ICD-10-CM | POA: Diagnosis not present

## 2024-08-16 LAB — CUP PACEART REMOTE DEVICE CHECK
Battery Remaining Longevity: 116 mo
Battery Remaining Percentage: 80 %
Battery Voltage: 3.04 V
Brady Statistic RV Percent Paced: 1 %
Date Time Interrogation Session: 20250901020016
Implantable Lead Connection Status: 753985
Implantable Lead Implant Date: 20210924
Implantable Lead Location: 753860
Implantable Pulse Generator Implant Date: 20210924
Lead Channel Impedance Value: 410 Ohm
Lead Channel Pacing Threshold Amplitude: 0.75 V
Lead Channel Pacing Threshold Pulse Width: 0.4 ms
Lead Channel Sensing Intrinsic Amplitude: 8.3 mV
Lead Channel Setting Pacing Amplitude: 2.5 V
Lead Channel Setting Pacing Pulse Width: 0.4 ms
Lead Channel Setting Sensing Sensitivity: 2 mV
Pulse Gen Model: 1272
Pulse Gen Serial Number: 3828073

## 2024-08-18 ENCOUNTER — Ambulatory Visit: Payer: Self-pay | Admitting: Internal Medicine

## 2024-08-22 NOTE — Progress Notes (Signed)
 Remote PPM Transmission

## 2024-08-25 ENCOUNTER — Telehealth: Payer: Self-pay

## 2024-08-25 NOTE — Telephone Encounter (Signed)
 Alert remote transmission: HVR Event occurred 9/11 @ 17:50, duration 52sec, HR 222.  EGM c/w irregular R-R, followed by 12sec of regularity, can not rule out NSVT - route to triage Hx of PAF, no OAC, palliative care  Patient has known hx of HVR events ranging from <44min to , with rates in 190'-low 200's bpm. Has been monitored by Dr. Waddell.  She is currently in a nursing facility and is past due for annual f/u with Dr. Waddell.   Spoke to nurse who states patient has had multiple recent falls on 9/3 and 9/7.  No injury, patient does not report any specific symptoms.   9/3 fall event does correlate with timing of 1 HVR episode.   Patient also is no longer on Carvedilol  and meds/care are being managed by [facility] providers as she is under Palliative Care and no longer travels out to appointments per facility nurse.   Current cardiac meds per facility: Amlodipine  10mg  every day Clonidine .1mg  1 daily prn for systollic>180 (new) Losartan  100mg  every day   Will review with Dr. Waddell to discuss next steps.

## 2024-08-26 NOTE — Telephone Encounter (Signed)
 Reviewed all with Dr. Waddell:   Episodes reviewed, GT agrees cannot rule out possibility of VT.  As patient is not traveling out to appointments, is being followed by [facility] providers and is under Palliative care, we will just continue to monitor episodes for now. No changes to medications.  Dr. Waddell is aware patient's carvedilol  has been discontinued.

## 2024-11-13 ENCOUNTER — Encounter

## 2024-11-13 ENCOUNTER — Ambulatory Visit

## 2024-11-13 DIAGNOSIS — I495 Sick sinus syndrome: Secondary | ICD-10-CM

## 2024-11-14 LAB — CUP PACEART REMOTE DEVICE CHECK
Battery Remaining Longevity: 115 mo
Battery Remaining Percentage: 79 %
Battery Voltage: 3.04 V
Brady Statistic RV Percent Paced: 1 %
Date Time Interrogation Session: 20251201020012
Implantable Lead Connection Status: 753985
Implantable Lead Implant Date: 20210924
Implantable Lead Location: 753860
Implantable Pulse Generator Implant Date: 20210924
Lead Channel Impedance Value: 430 Ohm
Lead Channel Pacing Threshold Amplitude: 0.75 V
Lead Channel Pacing Threshold Pulse Width: 0.4 ms
Lead Channel Sensing Intrinsic Amplitude: 8.8 mV
Lead Channel Setting Pacing Amplitude: 2.5 V
Lead Channel Setting Pacing Pulse Width: 0.4 ms
Lead Channel Setting Sensing Sensitivity: 2 mV
Pulse Gen Model: 1272
Pulse Gen Serial Number: 3828073

## 2024-11-16 ENCOUNTER — Ambulatory Visit: Payer: Self-pay | Admitting: Internal Medicine

## 2024-11-17 NOTE — Progress Notes (Signed)
 Remote PPM Transmission

## 2025-02-12 ENCOUNTER — Encounter

## 2025-05-14 ENCOUNTER — Encounter

## 2025-08-13 ENCOUNTER — Encounter

## 2025-11-12 ENCOUNTER — Encounter
